# Patient Record
Sex: Female | Born: 1985 | Race: Black or African American | Hispanic: No | Marital: Married | Smoking: Never smoker
Health system: Southern US, Community
[De-identification: ages and names within clinical notes are randomized; demographics above are authoritative.]

## PROBLEM LIST (undated history)

## (undated) DIAGNOSIS — E079 Disorder of thyroid, unspecified: Secondary | ICD-10-CM

## (undated) DIAGNOSIS — I10 Essential (primary) hypertension: Secondary | ICD-10-CM

## (undated) DIAGNOSIS — E669 Obesity, unspecified: Secondary | ICD-10-CM

## (undated) DIAGNOSIS — Z5189 Encounter for other specified aftercare: Secondary | ICD-10-CM

## (undated) DIAGNOSIS — D649 Anemia, unspecified: Secondary | ICD-10-CM

## (undated) DIAGNOSIS — E119 Type 2 diabetes mellitus without complications: Secondary | ICD-10-CM

## (undated) HISTORY — PX: CARPAL TUNNEL RELEASE: SHX101

## (undated) HISTORY — PX: LEEP: SHX91

## (undated) HISTORY — PX: THYROID SURGERY: SHX805

---
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
## 2008-02-18 ENCOUNTER — Emergency Department (HOSPITAL_BASED_OUTPATIENT_CLINIC_OR_DEPARTMENT_OTHER): Admission: EM | Admit: 2008-02-18 | Discharge: 2008-02-18 | Payer: Self-pay | Admitting: Emergency Medicine
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
## 2013-04-08 ENCOUNTER — Emergency Department (HOSPITAL_COMMUNITY)
Admission: EM | Admit: 2013-04-08 | Discharge: 2013-04-08 | Disposition: A | Discharge status: Home / Self Care | Payer: Medicaid Other | Attending: Emergency Medicine | Admitting: Emergency Medicine

## 2013-04-08 ENCOUNTER — Encounter (HOSPITAL_COMMUNITY): Payer: Self-pay | Admitting: Emergency Medicine

## 2013-04-08 DIAGNOSIS — IMO0002 Reserved for concepts with insufficient information to code with codable children: Secondary | ICD-10-CM | POA: Insufficient documentation

## 2013-04-08 DIAGNOSIS — L02412 Cutaneous abscess of left axilla: Secondary | ICD-10-CM

## 2013-04-08 DIAGNOSIS — E119 Type 2 diabetes mellitus without complications: Secondary | ICD-10-CM | POA: Insufficient documentation

## 2013-04-08 HISTORY — DX: Type 2 diabetes mellitus without complications: E11.9

## 2013-04-08 MED ORDER — SULFAMETHOXAZOLE-TRIMETHOPRIM 800-160 MG PO TABS: 1.0000 | ORAL_TABLET | Freq: Two times a day (BID) | ORAL | Status: AC

## 2013-04-08 MED ORDER — KETOROLAC TROMETHAMINE 60 MG/2ML IM SOLN
60.0000 mg | Freq: Once | INTRAMUSCULAR | Status: AC
  Administered 2013-04-08: 60 mg via INTRAMUSCULAR
  Filled 2013-04-08: qty 2

## 2013-04-08 NOTE — ED Provider Notes (Signed)
Medical screening examination/treatment/procedure(s) were performed by non-physician practitioner and as supervising physician I was immediately available for consultation/collaboration.  EKG Interpretation   None         Avis Tirone M Natalin Bible, MD 04/08/13 1657 

## 2013-04-08 NOTE — ED Notes (Signed)
Pt c/o abscess possible cyst under left arm x 3 months; pt sts hx of same and now having more pain; pt denies drainage

## 2013-04-08 NOTE — ED Provider Notes (Signed)
CSN: [id number]     Arrival date & time 04/08/13  1027 History  This chart was scribed for non-physician practitioner, Irish Elders, FNP,working with Lyanne Co, MD, by Karle Plumber, ED Scribe.  This patient was seen in room TR11C/TR11C and the patient's care was started at 11:01 AM.  Chief Complaint  Patient presents with  . Abscess   Patient is a 27 y.o. female presenting with abscess. The history is provided by the patient. No language interpreter was used.  Abscess Abscess location: left axillary. Size:  2-3 cm Abscess quality: painful   Abscess quality: not draining, no fluctuance, no induration, no itching, no redness, no warmth and not weeping   Red streaking: no   Duration:  3 months Progression:  Worsening Pain details:    Quality: sore.   Severity:  Moderate   Duration:  3 days   Timing:  Constant   Progression:  Worsening Chronicity:  Recurrent Context: diabetes   Relieved by:  Nothing Ineffective treatments:  Warm compresses Associated symptoms: no fever, no nausea and no vomiting   Risk factors: prior abscess    HPI Comments:  Colleen Schultz is a 27 y.o. female who presents to the Emergency Department complaining of a recurrent abscess under her left arm for approximately three months. She reports she has had no pain or issue with the abscess until two days ago. She now reports moderate tenderness of the area. She states she had an abscess in the same area approximately one year ago that was drained and healed well. She states the abscess comes and goes on its own intermittently. She states her PCP prescribed an antibiotic that resolved the abscess 4-5 months ago, but it returned again. She reports applying warm compresses and using a heating pad with no relief.  Pt denies fever, nausea, or vomiting.  Past Medical History  Diagnosis Date  . Diabetes mellitus without complication    History reviewed. No pertinent past surgical history. History reviewed. No  pertinent family history. History  Substance Use Topics  . Smoking status: Never Smoker   . Smokeless tobacco: Not on file  . Alcohol Use: No   OB History   Grav Para Term Preterm Abortions TAB SAB Ect Mult Living                 Review of Systems  Constitutional: Negative for fever.  Gastrointestinal: Negative for nausea and vomiting.  Skin:       Abscess  All other systems reviewed and are negative.    Allergies  Review of patient's allergies indicates no known allergies.  Home Medications  No current outpatient prescriptions on file. Triage Vitals: BP 151/93  Pulse 83  Temp(Src) 98.3 F (36.8 C) (Oral)  Resp 18  SpO2 100% Physical Exam  Constitutional: She is oriented to person, place, and time. She appears well-developed and well-nourished.  HENT:  Head: Normocephalic and atraumatic.  Neck: Neck supple.  Cardiovascular: Normal rate, regular rhythm and normal heart sounds.   Pulmonary/Chest: Effort normal and breath sounds normal. No respiratory distress. She has no wheezes. She has no rales. She exhibits no tenderness.  Musculoskeletal: Normal range of motion. She exhibits no edema and no tenderness.  Neurological: She is alert and oriented to person, place, and time. No cranial nerve deficit.  Skin: Skin is warm and dry.  2-3 cm left axillary cyst.  Psychiatric: She has a normal mood and affect. Her behavior is normal.    ED Course  Procedures (including critical care time) DIAGNOSTIC STUDIES: Oxygen Saturation is 100% on RA, normal by my interpretation.   COORDINATION OF CARE: 11:07 AM- Will give injection of Toradol here in ED and refer to surgeon. Pt verbalizes understanding and agrees to plan.  Medications  ketorolac (TORADOL) injection 60 mg (not administered)    Labs Review Labs Reviewed - No data to display Imaging Review No results found.  EKG Interpretation   None       MDM   1. Abscess of axilla, left    Afebrile, well-appearing.  Left axillary abscess. Reports that she has had repetitive problems with abscess in this same location for about the last year. She has had it drained before and has taken antibiotics for it as well. Bactrim as directed and follow-up with surgery for possible excision.   I personally performed the services described in this documentation, which was scribed in my presence. The recorded information has been reviewed and is accurate.    Irish Elders, NP 04/08/13 1246

## 2014-01-04 ENCOUNTER — Emergency Department (HOSPITAL_COMMUNITY): Payer: Medicaid Other

## 2014-01-04 ENCOUNTER — Emergency Department (HOSPITAL_COMMUNITY)
Admission: EM | Admit: 2014-01-04 | Discharge: 2014-01-04 | Disposition: A | Discharge status: Home / Self Care | Payer: Medicaid Other | Attending: Emergency Medicine | Admitting: Emergency Medicine

## 2014-01-04 ENCOUNTER — Encounter (HOSPITAL_COMMUNITY): Payer: Self-pay | Admitting: Emergency Medicine

## 2014-01-04 DIAGNOSIS — R110 Nausea: Secondary | ICD-10-CM

## 2014-01-04 DIAGNOSIS — Z794 Long term (current) use of insulin: Secondary | ICD-10-CM | POA: Insufficient documentation

## 2014-01-04 DIAGNOSIS — R0789 Other chest pain: Secondary | ICD-10-CM | POA: Diagnosis not present

## 2014-01-04 DIAGNOSIS — Z79899 Other long term (current) drug therapy: Secondary | ICD-10-CM | POA: Diagnosis not present

## 2014-01-04 DIAGNOSIS — Z3202 Encounter for pregnancy test, result negative: Secondary | ICD-10-CM | POA: Diagnosis not present

## 2014-01-04 DIAGNOSIS — E119 Type 2 diabetes mellitus without complications: Secondary | ICD-10-CM | POA: Insufficient documentation

## 2014-01-04 DIAGNOSIS — K089 Disorder of teeth and supporting structures, unspecified: Secondary | ICD-10-CM | POA: Diagnosis present

## 2014-01-04 DIAGNOSIS — K0889 Other specified disorders of teeth and supporting structures: Secondary | ICD-10-CM

## 2014-01-04 LAB — COMPREHENSIVE METABOLIC PANEL
ALT: 10 U/L (ref 0–35)
AST: 19 U/L (ref 0–37)
Albumin: 3.8 g/dL (ref 3.5–5.2)
Alkaline Phosphatase: 62 U/L (ref 39–117)
Anion gap: 11 (ref 5–15)
BUN: 10 mg/dL (ref 6–23)
CALCIUM: 9.1 mg/dL (ref 8.4–10.5)
CO2: 23 meq/L (ref 19–32)
CREATININE: 0.85 mg/dL (ref 0.50–1.10)
Chloride: 106 mEq/L (ref 96–112)
GFR calc Af Amer: >90 mL/min (ref >90)
Glucose, Bld: 94 mg/dL (ref 70–99)
Potassium: 4.1 mEq/L (ref 3.7–5.3)
SODIUM: 140 meq/L (ref 137–147)
TOTAL PROTEIN: 7.9 g/dL (ref 6.0–8.3)
Total Bilirubin: 0.6 mg/dL (ref 0.3–1.2)

## 2014-01-04 LAB — CBC
HCT: 33.1 % — ABNORMAL LOW (ref 36.0–46.0)
Hemoglobin: 10.3 g/dL — ABNORMAL LOW (ref 12.0–15.0)
MCH: 22.7 pg — ABNORMAL LOW (ref 26.0–34.0)
MCHC: 31.1 g/dL (ref 30.0–36.0)
MCV: 73.1 fL — ABNORMAL LOW (ref 78.0–100.0)
PLATELETS: 236 10*3/uL (ref 150–400)
RBC: 4.53 MIL/uL (ref 3.87–5.11)
RDW: 15.2 % (ref 11.5–15.5)
WBC: 8.3 10*3/uL (ref 4.0–10.5)

## 2014-01-04 LAB — URINALYSIS, ROUTINE W REFLEX MICROSCOPIC
Bilirubin Urine: NEGATIVE
Glucose, UA: NEGATIVE mg/dL
HGB URINE DIPSTICK: NEGATIVE
Ketones, ur: NEGATIVE mg/dL
Nitrite: NEGATIVE
PROTEIN: NEGATIVE mg/dL
SPECIFIC GRAVITY, URINE: 1.026 (ref 1.005–1.030)
Urobilinogen, UA: 1 mg/dL (ref 0.0–1.0)
pH: 6 (ref 5.0–8.0)

## 2014-01-04 LAB — I-STAT TROPONIN, ED: Troponin i, poc: 0 ng/mL (ref 0.00–0.08)

## 2014-01-04 LAB — URINE MICROSCOPIC-ADD ON

## 2014-01-04 LAB — PREGNANCY, URINE: Preg Test, Ur: NEGATIVE

## 2014-01-04 MED ORDER — ONDANSETRON HCL 4 MG PO TABS: 4.0000 mg | ORAL_TABLET | Freq: Four times a day (QID) | ORAL | Status: DC

## 2014-01-04 MED ORDER — SODIUM CHLORIDE 0.9 % IV BOLUS (SEPSIS)
1000.0000 mL | Freq: Once | INTRAVENOUS | Status: AC
  Administered 2014-01-04: 1000 mL via INTRAVENOUS

## 2014-01-04 MED ORDER — KETOROLAC TROMETHAMINE 15 MG/ML IJ SOLN
30.0000 mg | Freq: Once | INTRAMUSCULAR | Status: AC
  Administered 2014-01-04: 30 mg via INTRAVENOUS
  Filled 2014-01-04: qty 2

## 2014-01-04 MED ORDER — ONDANSETRON HCL 4 MG/2ML IJ SOLN
4.0000 mg | Freq: Once | INTRAMUSCULAR | Status: AC
  Administered 2014-01-04: 4 mg via INTRAVENOUS
  Filled 2014-01-04: qty 2

## 2014-01-04 MED ORDER — IBUPROFEN 600 MG PO TABS: 600.0000 mg | ORAL_TABLET | Freq: Four times a day (QID) | ORAL | Status: DC | PRN

## 2014-01-04 NOTE — ED Notes (Signed)
Pt visitor and self brought sand which bag, graham crackers X 3 times, and beverages X 2 times over the past three hours.

## 2014-01-04 NOTE — ED Notes (Signed)
Pt c/o generalized abd pain and CP; pt sts some mouth pain also x 2 days

## 2014-01-04 NOTE — Discharge Instructions (Signed)

## 2014-01-04 NOTE — ED Provider Notes (Signed)
A 28 year old female comes in with about a three-day history of anterior chest pain which is worse with movement and deep breathing, and also pain in left T. She points to tooth #12 as the one that is hurting but on exam, she has tenderness to percussion of teeth #12 through 15. Oropharynx is clear and TMs are clear. There is no gingival erythema or pallor or swelling. No obvious active caries are present. Lungs are clear. Heart has regular rate and rhythm. There is prominent tenderness of the parasternal areas bilaterally indicating probable chest wall pain. She is referred to the on-call dentist for evaluation of her teeth and is advised to use NSAIDs for pain.  I saw and evaluated the patient, reviewed the resident's note and I agree with the findings and plan.   EKG Interpretation   Date/Time:  Monday January 04 2014 14:04:06 EDT Ventricular Rate:  59 PR Interval:  114 QRS Duration: 96 QT Interval:  422 QTC Calculation: 417 R Axis:   57 Text Interpretation:  Sinus bradycardia Otherwise normal ECG No old  tracing to compare Confirmed by [facility]  MD, Kess Mcilwain ([id number]) on 01/04/2014  5:24:05 PM        Dione Booze, MD 01/04/14 1724

## 2014-01-04 NOTE — ED Provider Notes (Signed)
CSN: [id number]     Arrival date & time 01/04/14  1354 History   First MD Initiated Contact with Patient 01/04/14 1555     Chief Complaint  Patient presents with  . Chest Pain  . Dental Pain     (Consider location/radiation/quality/duration/timing/severity/associated sxs/prior Treatment) HPI  Colleen Schultz is a 28 y.o. female with a history of diabetes, presenting for chest pain and dental pain. Patient has difficulty giving specifics regarding her multiple complaints. She is currently endorsing chest pain on the left side of her chest, associated with tenderness on palpation. Endorses a recent cough. No association with exertion. No shortness of breath. Endorses nausea, no vomiting, no diarrhea. Currently sexually active without use of condoms. Patient was previously on Depo shots but discontinued this as of December last year. Patient is due to start her menstrual cycle in the next day or so. Endorses regular menstrual cycles. LMP approximately 1 month ago. Denies any vaginal complaints at this time, no abdominal pain at this time. No dysuria. Nausea has been ongoing for approximately 2 weeks. Patient has had dental pain for approximately 2 days around her left maxillary first premolar.     Past Medical History  Diagnosis Date  . Diabetes mellitus without complication    History reviewed. No pertinent past surgical history. History reviewed. No pertinent family history. History  Substance Use Topics  . Smoking status: Never Smoker   . Smokeless tobacco: Not on file  . Alcohol Use: No   OB History   Grav Para Term Preterm Abortions TAB SAB Ect Mult Living                 Review of Systems  Constitutional: Negative.   HENT: Positive for dental problem.   Eyes: Negative.   Respiratory: Positive for cough.   Cardiovascular: Positive for chest pain.  Gastrointestinal: Negative.   Endocrine: Negative.   Genitourinary: Negative.   Musculoskeletal: Negative.   Skin: Negative.    Allergic/Immunologic: Negative.   Neurological: Negative.   Hematological: Negative.   Psychiatric/Behavioral: Negative.       Allergies  Adhesive and Cinnamon  Home Medications   Prior to Admission medications   Medication Sig Start Date End Date Taking? Authorizing Provider  busPIRone (BUSPAR) 15 MG tablet Take 15 mg by mouth 2 (two) times daily.   Yes Historical Provider, MD  hydrOXYzine (ATARAX/VISTARIL) 25 MG tablet Take 25-50 mg by mouth at bedtime.    Yes Historical Provider, MD  insulin aspart (NOVOLOG FLEXPEN) 100 UNIT/ML SOPN FlexPen Inject 8 Units into the skin daily.   Yes Historical Provider, MD  insulin NPH (NOVOLIN N) 100 UNIT/ML injection Inject 8 Units into the skin daily before breakfast.   Yes Historical Provider, MD  Iron-FA-B Cmp-C-Biot-Probiotic (FUSION PLUS PO) Take 1 capsule by mouth 3 (three) times daily.   Yes Historical Provider, MD  lisinopril (PRINIVIL,ZESTRIL) 2.5 MG tablet Take 2.5 mg by mouth daily.   Yes Historical Provider, MD  ibuprofen (ADVIL,MOTRIN) 600 MG tablet Take 1 tablet (600 mg total) by mouth every 6 (six) hours as needed. 01/04/14   Gavin Pound, MD  ondansetron (ZOFRAN) 4 MG tablet Take 1 tablet (4 mg total) by mouth every 6 (six) hours. 01/04/14   Gavin Pound, MD   BP 114/69  Pulse 63  Temp(Src) 98.3 F (36.8 C) (Oral)  Resp 12  SpO2 98%  LMP 11/30/2013 Physical Exam  Nursing note and vitals reviewed. Constitutional: She is oriented to person, place, and time. She  appears well-developed and well-nourished. No distress.  HENT:  Head: Normocephalic and atraumatic.  Right Ear: External ear normal.  Left Ear: External ear normal.  Nose: Nose normal.  Mouth/Throat: Oropharynx is clear and moist. No oral lesions. No trismus in the jaw. No dental abscesses. No oropharyngeal exudate.    Eyes: Conjunctivae and EOM are normal. Pupils are equal, round, and reactive to light. Right eye exhibits no discharge. Left eye exhibits no  discharge. No scleral icterus.  Neck: Normal range of motion. Neck supple. No JVD present. No tracheal deviation present. No thyromegaly present.  Cardiovascular: Normal rate, regular rhythm, normal heart sounds and intact distal pulses.  Exam reveals no gallop and no friction rub.   No murmur heard. Pulmonary/Chest: Effort normal and breath sounds normal. No stridor. No respiratory distress. She has no wheezes. She has no rales. She exhibits tenderness.  Abdominal: Soft. She exhibits no distension. There is no tenderness. There is no rebound and no guarding.  Musculoskeletal: Normal range of motion. She exhibits no edema and no tenderness.  Lymphadenopathy:    She has no cervical adenopathy.  Neurological: She is alert and oriented to person, place, and time. She is not disoriented. GCS eye subscore is 4. GCS verbal subscore is 5. GCS motor subscore is 6.  Skin: Skin is warm and dry. No rash noted. She is not diaphoretic. No erythema. No pallor.  Psychiatric: She has a normal mood and affect. Her behavior is normal. Judgment and thought content normal.    ED Course  Procedures (including critical care time) Labs Review Labs Reviewed  CBC - Abnormal; Notable for the following:    Hemoglobin 10.3 (*)    HCT 33.1 (*)    MCV 73.1 (*)    MCH 22.7 (*)    All other components within normal limits  URINALYSIS, ROUTINE W REFLEX MICROSCOPIC - Abnormal; Notable for the following:    APPearance HAZY (*)    Leukocytes, UA SMALL (*)    All other components within normal limits  URINE MICROSCOPIC-ADD ON - Abnormal; Notable for the following:    Squamous Epithelial / LPF MANY (*)    Bacteria, UA MANY (*)    All other components within normal limits  COMPREHENSIVE METABOLIC PANEL  PREGNANCY, URINE  I-STAT TROPOININ, ED  POC URINE PREG, ED    Imaging Review Dg Chest 2 View  01/04/2014   CLINICAL DATA:  Mid and left-sided chest pain for 2 days  EXAM: CHEST  2 VIEW  COMPARISON:  None.  FINDINGS:  The lungs are well-expanded and clear. The heart and pulmonary vascularity is normal. The mediastinum is normal in width. There is no pleural effusion. The bony thorax is unremarkable.  IMPRESSION: There is no acute cardiopulmonary abnormality.   Electronically Signed   By: David  [place]   On: 01/04/2014 14:58     EKG Interpretation   Date/Time:  Monday January 04 2014 14:04:06 EDT Ventricular Rate:  59 PR Interval:  114 QRS Duration: 96 QT Interval:  422 QTC Calculation: 417 R Axis:   57 Text Interpretation:  Sinus bradycardia Otherwise normal ECG No old  tracing to compare Confirmed by [facility]  MD, DAVID ([id number]) on 01/04/2014  5:24:05 PM      MDM   Final diagnoses:  Pain, dental  Musculoskeletal chest pain  Nausea    Patient symptoms consistent with possible pregnancy, however pregnancy testing is negative. Low concern for cardiac etiology due to duration of symptoms, and atypical findings, also associated  with palpation of chest wall. Likely costochondral chest pain. Possibly associated with viral illness due to patient's cough. No sign of pneumonia. Low concern for PE as pt is PERC negative.  Will treat patient symptomatically and reevaluate. Patient has an appetite following antiemetics and is feeling better. Will discharge with Zofran, and Motrin for pain. Patient advised to follow up with dentistry on call for her dental pain and provided with followup information. No sign of overt abscess, or significant soft tissue infection around her left maxillary first premolar area, no indication for antibiotics.  Patient given return precautions for Non-specific chest pain, and dental pain.  Advised to return for worsening symptoms including chest pain, shortness of breath, severe headache, intractable nausea or vomiting, fever, or chills, inability to take medications, or other acute concerns.  Advised to follow up with dentist in 2 days.  Patient in agreement with and expressed understanding  of follow plan, plan of care, and return precautions.  All questions answered prior to discharge.  Patient was discharged in stable condition, ambulating without difficulty.  Patient care was discussed with my attending, Dr. Preston Fleeting.   Gavin Pound, MD 01/05/14 [phone]

## 2015-03-11 ENCOUNTER — Encounter (HOSPITAL_BASED_OUTPATIENT_CLINIC_OR_DEPARTMENT_OTHER): Payer: Self-pay | Admitting: *Deleted

## 2015-03-11 ENCOUNTER — Emergency Department (HOSPITAL_BASED_OUTPATIENT_CLINIC_OR_DEPARTMENT_OTHER)
Admission: EM | Admit: 2015-03-11 | Discharge: 2015-03-12 | Disposition: A | Discharge status: Home / Self Care | Payer: Medicaid Other | Attending: Emergency Medicine | Admitting: Emergency Medicine

## 2015-03-11 DIAGNOSIS — G8918 Other acute postprocedural pain: Secondary | ICD-10-CM

## 2015-03-11 DIAGNOSIS — E119 Type 2 diabetes mellitus without complications: Secondary | ICD-10-CM | POA: Insufficient documentation

## 2015-03-11 DIAGNOSIS — Z79899 Other long term (current) drug therapy: Secondary | ICD-10-CM | POA: Insufficient documentation

## 2015-03-11 DIAGNOSIS — M79601 Pain in right arm: Secondary | ICD-10-CM | POA: Insufficient documentation

## 2015-03-11 DIAGNOSIS — Z794 Long term (current) use of insulin: Secondary | ICD-10-CM | POA: Insufficient documentation

## 2015-03-11 MED ORDER — OXYCODONE-ACETAMINOPHEN 5-325 MG PO TABS: 1.0000 | ORAL_TABLET | Freq: Four times a day (QID) | ORAL | Status: DC | PRN

## 2015-03-11 MED ORDER — HYDROMORPHONE HCL 1 MG/ML IJ SOLN
1.0000 mg | Freq: Once | INTRAMUSCULAR | Status: AC
  Administered 2015-03-12: 1 mg via INTRAMUSCULAR
  Filled 2015-03-11: qty 1

## 2015-03-11 MED ORDER — ETODOLAC 300 MG PO CAPS: 300.0000 mg | ORAL_CAPSULE | Freq: Three times a day (TID) | ORAL | Status: DC

## 2015-03-11 NOTE — Discharge Instructions (Signed)
Pain Relief Preoperatively and Postoperatively  If you have questions, problems, or concerns about the pain that you may feel after surgery, let your health care provider know. Patients have the right to assessment and management of pain. Severe pain after surgery--and the fear or anxiety associated with that pain--may cause extreme discomfort that:  · Prevents sleep.  · Decreases the ability to breathe deeply and to cough. This can result in pneumonia or other upper airway infections.  · Causes the heart to beat more quickly and the blood pressure to be higher.  · Increases the risk for constipation and bloating.  · Decreases the ability of wounds to heal.  · May result in depression, increased anxiety, and feelings of helplessness.  Relieving pain before surgery (preoperatively) is also important because it lessens pain that you have after surgery (postoperatively). Patients who receive pain relief both before and after surgery experience greater pain relief than those who receive pain relief only after surgery. Let your health care provider know if you are having uncontrolled pain. This is very important. Pain after surgery is more difficult to manage if it is severe, so receiving prompt and adequate treatment of acute pain is necessary. If you become constipated after taking pain medicine, drink more liquids if you can. Your health care provider may have you take a mild laxative.  PAIN CONTROL METHODS  Your health care providers follow policies and procedures about the management of your pain. These guidelines should be explained to you before surgery. Plans for pain control after surgery must be decided upon by you and your health care provider and put into use with your full understanding and agreement. Do not be afraid to ask questions about the care that you are receiving.  Your health care providers will attempt to control your pain in various ways, and these methods may be used together (multimodal  analgesia). Using this approach has many benefits for you, including being able to eat, move around, and leave the hospital sooner.  As-Needed Pain Control  · You may be given pain medicine through an IV tube or as a pill or liquid that you can swallow. Let your health care provider know when you are having pain, and he or she will give you the pain medicine that is ordered for you.  IV Patient-Controlled Analgesia (PCA) Pump  · You can receive your pain medicine through an IV tube that goes into one of your veins. You can control the amount of pain medicine that you get. The pain medicine is controlled by a pump. When you push the button that is hooked up to this pump, you receive a specific amount of pain medicine. This button should be pushed only by you or by someone who is specifically assigned by you to do so. It is set up to keep you from accidentally giving yourself too much pain medicine. You will be able to start using your pain pump in the recovery room after your surgery. This method can be helpful for most types of surgery.  · Tell your health care provider:    If you are having too much pain.    If you are feeling too sleepy or nauseous.  Continuous Epidural Pain Control  · A thin, soft tube (catheter) is put into your back, outside the outer layer of your spinal cord. Pain medicine flows through the catheter to lessen pain in areas of your body that are below the level of catheter placement. Continuous epidural pain control may work best for you   if you are having surgery on your abdomen, hip area, or legs. The epidural catheter is usually put into your back shortly before surgery. It is left in until you can eat, take medicine by mouth, pass urine, and have a bowel movement.  · Giving pain medicine through the epidural catheter may help you to heal more quickly because you can do these things sooner:    Regain normal bowel and bladder function.    Return to eating.    Get up and walk.  Medicine That  Numbs the Area (Local Anesthetic)  You may be given pain medicine:  · As an injection near the area of the pain (local infiltration).  · As an injection near the nerve that controls the sensation to a specific part of your body (peripheral nerve block).  · In your spine to block pain (spinal block).  · Through a local anesthetic reservoir pump. If your surgeon or anesthesiologist selects this option as a part of your pain control, one or more thin, soft tubes will be inserted into your incision site(s) at the end of surgery. These tubes will be connected to a device that is filled with a non-narcotic pain medicine. This medicine gradually empties into your incision site over the next several days. Usually, after all of the medicine is used, your health care provider will remove the tubes and throw away the device.  Opioids  · Moderate to moderately severe acute pain after surgery may respond to opioids. Opioids are narcotic pain medicine. Opioids are often combined with non-narcotic medicines to improve pain relief, lower the risk of side effects, and reduce the chance of addiction.  · If you follow your health care provider's directions about taking opioids and you do not have a history of substance abuse, your risk of becoming addicted is very small. To prevent addiction, opioids are given for short periods of time in careful doses.  Other Methods of Pain Control  · Steroids.  · Physical therapy.  · Heat and cold therapy.  · Compression, such as wrapping an elastic bandage around the area of the pain.  · Massage.     This information is not intended to replace advice given to you by your health care provider. Make sure you discuss any questions you have with your health care provider.     Document Released: 07/21/2002 Document Revised: 05/21/2014 Document Reviewed: 07/25/2010  Elsevier Interactive Patient Education ©2016 [employer]

## 2015-03-11 NOTE — ED Notes (Signed)
Pt had right carpal tunnel surgery yesterday, reports that the numbness has worn off and and she is hurting.  Pt reports that she lost her f/u paperwork.  Pt took 4 Vicodin in the last hour without relief.

## 2015-03-11 NOTE — ED Provider Notes (Signed)
CSN: [id number]     Arrival date & time 03/11/15  2156 History   First MD Initiated Contact with Patient 03/11/15 2344     Chief Complaint  Patient presents with  . Arm Pain   HPI Comments: Patient had carpal tunnel surgery performed yesterday. Patient states since the numbing medicine has worn off she has had increasing pain. Pain is throbbing in the right wrist Patient has been taking the pain medications that were given to her. She took 4 Vicodin earlier without relief.  Patient denies any fevers or chills.  Patient is a 29 y.o. female presenting with arm pain. The history is provided by the patient.  Arm Pain This is a new problem. The problem occurs constantly. The problem has been gradually worsening. Pertinent negatives include no abdominal pain, no headaches and no shortness of breath. Exacerbated by: Palpation and movement. Nothing relieves the symptoms. She has tried nothing for the symptoms.    Past Medical History  Diagnosis Date  . Diabetes mellitus without complication (HCC)    History reviewed. No pertinent past surgical history. History reviewed. No pertinent family history. Social History  Substance Use Topics  . Smoking status: Never Smoker   . Smokeless tobacco: None  . Alcohol Use: No   OB History    No data available     Review of Systems  Respiratory: Negative for shortness of breath.   Gastrointestinal: Negative for abdominal pain.  Neurological: Negative for headaches.  All other systems reviewed and are negative.     Allergies  Adhesive and Cinnamon  Home Medications   Prior to Admission medications   Medication Sig Start Date End Date Taking? Authorizing Provider  busPIRone (BUSPAR) 15 MG tablet Take 15 mg by mouth 2 (two) times daily.    Historical Provider, MD  etodolac (LODINE) 300 MG capsule Take 1 capsule (300 mg total) by mouth every 8 (eight) hours. 03/11/15   Linwood Dibbles, MD  hydrOXYzine (ATARAX/VISTARIL) 25 MG tablet Take 25-50 mg by  mouth at bedtime.     Historical Provider, MD  insulin aspart (NOVOLOG FLEXPEN) 100 UNIT/ML SOPN FlexPen Inject 8 Units into the skin daily.    Historical Provider, MD  insulin NPH (NOVOLIN N) 100 UNIT/ML injection Inject 8 Units into the skin daily before breakfast.    Historical Provider, MD  Iron-FA-B Cmp-C-Biot-Probiotic (FUSION PLUS PO) Take 1 capsule by mouth 3 (three) times daily.    Historical Provider, MD  lisinopril (PRINIVIL,ZESTRIL) 2.5 MG tablet Take 2.5 mg by mouth daily.    Historical Provider, MD  ondansetron (ZOFRAN) 4 MG tablet Take 1 tablet (4 mg total) by mouth every 6 (six) hours. 01/04/14   Gavin Pound, MD  oxyCODONE-acetaminophen (PERCOCET) 5-325 MG tablet Take 1 tablet by mouth every 6 (six) hours as needed. 03/11/15   Linwood Dibbles, MD   BP 124/74 mmHg  Pulse 79  Temp(Src) 97.9 F (36.6 C)  Resp 18  SpO2 100%  LMP 01/08/2015 Physical Exam  Constitutional: She appears well-developed and well-nourished. No distress.  HENT:  Head: Normocephalic and atraumatic.  Right Ear: External ear normal.  Left Ear: External ear normal.  Eyes: Conjunctivae are normal. Right eye exhibits no discharge. Left eye exhibits no discharge. No scleral icterus.  Neck: Neck supple. No tracheal deviation present.  Cardiovascular: Normal rate.   Pulmonary/Chest: Effort normal. No stridor. No respiratory distress.  Musculoskeletal: She exhibits tenderness. She exhibits no edema.  Dressing was removed from the right wrist, patient has tenderness palpation  along the volar aspect of her wrist. There is a small surgical scar without evidence of erythema or drainage. She has a strong radial pulse. Distal cap refill is less than 2 seconds. Initial light touch is intact  Neurological: She is alert. Cranial nerve deficit: no gross deficits.  Skin: Skin is warm and dry. No rash noted.  Psychiatric: She has a normal mood and affect.  Nursing note and vitals reviewed.   ED Course  Procedures  (including critical care time)   MDM   Final diagnoses:  Post-operative pain    Patient has no evidence of infection or vascular compromise. We'll give her dose of Dilaudid IM for pain relief. I suggested she contact her surgeon to let them know about her persistent pain.  At this time there does not appear to be any evidence of an acute emergency medical condition and the patient appears stable for discharge with appropriate outpatient follow up.     Linwood DibblesJon Brettany Sydney, MD 03/12/15 0000

## 2018-04-03 ENCOUNTER — Ambulatory Visit (INDEPENDENT_AMBULATORY_CARE_PROVIDER_SITE_OTHER): Payer: Self-pay

## 2018-04-03 ENCOUNTER — Other Ambulatory Visit: Payer: Self-pay | Admitting: Gerontology

## 2018-04-03 DIAGNOSIS — M25512 Pain in left shoulder: Secondary | ICD-10-CM

## 2018-04-03 DIAGNOSIS — R52 Pain, unspecified: Secondary | ICD-10-CM

## 2018-04-03 DIAGNOSIS — M533 Sacrococcygeal disorders, not elsewhere classified: Secondary | ICD-10-CM

## 2018-04-03 DIAGNOSIS — M545 Low back pain: Secondary | ICD-10-CM

## 2018-04-03 DIAGNOSIS — M79642 Pain in left hand: Secondary | ICD-10-CM

## 2019-02-02 ENCOUNTER — Other Ambulatory Visit: Payer: Self-pay

## 2019-02-02 ENCOUNTER — Encounter (HOSPITAL_BASED_OUTPATIENT_CLINIC_OR_DEPARTMENT_OTHER): Payer: Self-pay | Admitting: Emergency Medicine

## 2019-02-02 ENCOUNTER — Emergency Department (HOSPITAL_BASED_OUTPATIENT_CLINIC_OR_DEPARTMENT_OTHER): Payer: Medicaid Other

## 2019-02-02 ENCOUNTER — Emergency Department (HOSPITAL_BASED_OUTPATIENT_CLINIC_OR_DEPARTMENT_OTHER)
Admission: EM | Admit: 2019-02-02 | Discharge: 2019-02-02 | Disposition: A | Discharge status: Home / Self Care | Payer: Medicaid Other | Attending: Emergency Medicine | Admitting: Emergency Medicine

## 2019-02-02 DIAGNOSIS — I10 Essential (primary) hypertension: Secondary | ICD-10-CM | POA: Insufficient documentation

## 2019-02-02 DIAGNOSIS — Z79899 Other long term (current) drug therapy: Secondary | ICD-10-CM | POA: Insufficient documentation

## 2019-02-02 DIAGNOSIS — E119 Type 2 diabetes mellitus without complications: Secondary | ICD-10-CM | POA: Diagnosis not present

## 2019-02-02 DIAGNOSIS — M25511 Pain in right shoulder: Secondary | ICD-10-CM | POA: Insufficient documentation

## 2019-02-02 HISTORY — DX: Obesity, unspecified: E66.9

## 2019-02-02 HISTORY — DX: Essential (primary) hypertension: I10

## 2019-02-02 HISTORY — DX: Anemia, unspecified: D64.9

## 2019-02-02 NOTE — ED Triage Notes (Signed)
Right shoulder pain for a few weeks with no known injury.

## 2019-02-02 NOTE — ED Notes (Signed)
Patient transported to X-ray 

## 2019-02-02 NOTE — Discharge Instructions (Addendum)
Symptoms suggestive of rotator cuff injury.  Make an appointment to follow-up with sports medicine upstairs.  Wear the sling for your comfort.

## 2019-02-02 NOTE — ED Provider Notes (Signed)
[facility] Provider Note   CSN: [id number] Arrival date & time: 02/02/19  5102     History   Chief Complaint Chief Complaint  Patient presents with   Shoulder Pain    HPI Colleen Schultz is a 33 y.o. female.     Patient with several week history of right shoulder pain.  Gotten worse lately.  Now cannot raise arm above head.  No known injury.  No known history of any heavy lifting.  No prior history of similar pain.  No numbness to the hand other than some baseline carpal tunnel numbness but nothing new or worse.     Past Medical History:  Diagnosis Date   Anemia    Diabetes mellitus without complication ([place])    Hypertension    Obesity     There are no active problems to display for this patient.   Past Surgical History:  Procedure Laterality Date   CARPAL TUNNEL RELEASE     CESAREAN SECTION     LEEP     THYROID SURGERY       OB History   No obstetric history on file.      Home Medications    Prior to Admission medications   Medication Sig Start Date End Date Taking? Authorizing Provider  Iron-FA-B Cmp-C-Biot-Probiotic (FUSION PLUS PO) Take 1 capsule by mouth 3 (three) times daily.    [provider]  lisinopril (PRINIVIL,ZESTRIL) 2.5 MG tablet Take 2.5 mg by mouth daily.    [provider]  meloxicam (MOBIC) 15 MG tablet Take 1 tablet (15 mg total) by mouth daily. 01/07/19   [provider]  pregabalin (LYRICA) 200 MG capsule Take by mouth.    [provider]  Prenatal Vit-Iron Carbonyl-FA (PRENATAL PLUS IRON) 29-1 MG TABS Take 1 tablet by mouth daily. 01/20/19   [provider]  promethazine (PHENERGAN) 12.5 MG tablet Take 1 tablet (12.5 mg total) by mouth every 6 (six) hours as needed for up to 30 days for Nausea 12/22/18   [provider]    Family History No family history on file.  Social History Social History   Tobacco Use   Smoking status: Never  Smoker   Smokeless tobacco: Never Used  Substance Use Topics   Alcohol use: No   Drug use: No     Allergies   Adhesive [tape] and Cinnamon   Review of Systems Review of Systems  Constitutional: Negative for chills and fever.  HENT: Negative for congestion, rhinorrhea and sore throat.   Eyes: Negative for visual disturbance.  Respiratory: Negative for cough and shortness of breath.   Cardiovascular: Negative for chest pain and leg swelling.  Gastrointestinal: Negative for abdominal pain, diarrhea, nausea and vomiting.  Genitourinary: Negative for dysuria.  Musculoskeletal: Negative for back pain and neck pain.  Skin: Negative for rash.  Neurological: Negative for dizziness, light-headedness and headaches.  Hematological: Does not bruise/bleed easily.  Psychiatric/Behavioral: Negative for confusion.     Physical Exam Updated Vital Signs BP 133/82 (BP Location: Right Arm)    Pulse 87    Temp 98.1 F (36.7 C) (Oral)    Resp 16    Ht 1.6 m (5\' 3" )    Wt 132.6 kg    LMP 12/13/2018    SpO2 100%    BMI 51.80 kg/m   Physical Exam Vitals signs and nursing note reviewed.  Constitutional:      General: She is not in acute distress.  Appearance: She is well-developed.  HENT:     Head: Normocephalic and atraumatic.  Eyes:     Extraocular Movements: Extraocular movements intact.     Conjunctiva/sclera: Conjunctivae normal.     Pupils: Pupils are equal, round, and reactive to light.  Neck:     Musculoskeletal: Normal range of motion and neck supple.  Cardiovascular:     Rate and Rhythm: Normal rate and regular rhythm.     Heart sounds: No murmur.  Pulmonary:     Effort: Pulmonary effort is normal. No respiratory distress.     Breath sounds: Normal breath sounds.  Abdominal:     Palpations: Abdomen is soft.     Tenderness: There is no abdominal tenderness.  Musculoskeletal:     Comments: Tenderness to palpation to the anterior part of the right shoulder.  No obvious  deformity or dislocation.  Increased pain with movement of arm above head.  Not able to move arm completely above head.  So therefore decreased range of motion.  Distally radial pulses 2+.  Sensation intact good movement and strength in the fingers.  No pain at wrist or elbow.  Skin:    General: Skin is warm and dry.  Neurological:     General: No focal deficit present.     Mental Status: She is alert and oriented to person, place, and time.      ED Treatments / Results  Labs (all labs ordered are listed, but only abnormal results are displayed) Labs Reviewed - No data to display  EKG None  Radiology Dg Shoulder Right  Result Date: 02/02/2019 CLINICAL DATA:  Right shoulder pain.  No known injury. EXAM: RIGHT SHOULDER - 2+ VIEW COMPARISON:  Chest x-ray report 01/04/2014. FINDINGS: No acute bony or joint abnormality. No evidence of fracture, dislocation, or separation. No focal abnormality identified. IMPRESSION: No acute or focal abnormality identified. Electronically Signed   By: Maisie Fus  Register   On: 02/02/2019 09:45    Procedures Procedures (including critical care time)  Medications Ordered in ED Medications - No data to display   Initial Impression / Assessment and Plan / ED Course  I have reviewed the triage vital signs and the nursing notes.  Pertinent labs & imaging results that were available during my care of the patient were reviewed by me and considered in my medical decision making (see chart for details).        X-ray of the right shoulder shows no bony abnormalities.  Clinically suggestive of rotator cuff injury.  Will refer to sports medicine and treat with a sling.  Patient is been taking Tylenol and Motrin already at home.  Final Clinical Impressions(s) / ED Diagnoses   Final diagnoses:  Acute pain of right shoulder    ED Discharge Orders    None       Vanetta Mulders, MD 02/02/19 1020

## 2019-02-02 NOTE — ED Notes (Signed)
ED Provider at bedside. 

## 2019-02-02 NOTE — ED Notes (Signed)
ED Provider at bedside discussing test results and disp plan of care.

## 2019-02-04 ENCOUNTER — Ambulatory Visit: Payer: Self-pay | Admitting: Family Medicine

## 2019-02-04 NOTE — Progress Notes (Deleted)
  Colleen Schultz - 33 y.o. female MRN [id number]  Date of birth: [birth year]  SUBJECTIVE:  Including CC & ROS.  No chief complaint on file.   Colleen Schultz is a 33 y.o. female that is  ***.  ***   Review of Systems  HISTORY: Past Medical, Surgical, Social, and Family History Reviewed & Updated per EMR.   Pertinent Historical Findings include:  Past Medical History:  Diagnosis Date  . Anemia   . Diabetes mellitus without complication ([place])   . Hypertension   . Obesity     Past Surgical History:  Procedure Laterality Date  . CARPAL TUNNEL RELEASE    . CESAREAN SECTION    . LEEP    . THYROID SURGERY      Allergies  Allergen Reactions  . Adhesive [Tape] Itching  . Cinnamon Hives    No family history on file.   Social History   Socioeconomic History  . Marital status: Single    Spouse name: Not on file  . Number of children: Not on file  . Years of education: Not on file  . Highest education level: Not on file  Occupational History  . Not on file  Social Needs  . Financial resource strain: Not on file  . Food insecurity    Worry: Not on file    Inability: Not on file  . Transportation needs    Medical: Not on file    Non-medical: Not on file  Tobacco Use  . Smoking status: Never Smoker  . Smokeless tobacco: Never Used  Substance and Sexual Activity  . Alcohol use: No  . Drug use: No  . Sexual activity: Not on file  Lifestyle  . Physical activity    Days per week: Not on file    Minutes per session: Not on file  . Stress: Not on file  Relationships  . Social Herbalist on phone: Not on file    Gets together: Not on file    Attends religious service: Not on file    Active member of club or organization: Not on file    Attends meetings of clubs or organizations: Not on file    Relationship status: Not on file  . Intimate partner violence    Fear of current or ex partner: Not on file    Emotionally abused: Not on file    Physically  abused: Not on file    Forced sexual activity: Not on file  Other Topics Concern  . Not on file  Social History Narrative  . Not on file     PHYSICAL EXAM:  VS: There were no vitals taken for this visit. Physical Exam Gen: NAD, alert, cooperative with exam, well-appearing ENT: normal lips, normal nasal mucosa,  Eye: normal EOM, normal conjunctiva and lids CV:  no edema, +2 pedal pulses   Resp: no accessory muscle use, non-labored,  GI: no masses or tenderness, no hernia  Skin: no rashes, no areas of induration  Neuro: normal tone, normal sensation to touch Psych:  normal insight, alert and oriented MSK:  ***      ASSESSMENT & PLAN:   No problem-specific Assessment & Plan notes found for this encounter.

## 2019-02-05 ENCOUNTER — Encounter: Payer: Self-pay | Admitting: Family Medicine

## 2019-02-05 ENCOUNTER — Ambulatory Visit: Payer: Self-pay

## 2019-02-05 ENCOUNTER — Other Ambulatory Visit: Payer: Self-pay

## 2019-02-05 ENCOUNTER — Ambulatory Visit (INDEPENDENT_AMBULATORY_CARE_PROVIDER_SITE_OTHER): Payer: Medicaid Other | Admitting: Family Medicine

## 2019-02-05 VITALS — BP 146/83 | Ht 63.0 in | Wt 279.0 lb

## 2019-02-05 DIAGNOSIS — M25511 Pain in right shoulder: Secondary | ICD-10-CM | POA: Diagnosis not present

## 2019-02-05 MED ORDER — PREDNISONE 5 MG PO TABS: ORAL_TABLET | ORAL | 0 refills | Status: DC

## 2019-02-05 NOTE — Assessment & Plan Note (Signed)
Pain seems to be associated with a capsulitis versus a labral irritation.  The rotator cuff appears to be normal with clinical exam and ultrasound.  No significant findings on ultrasound. -Prednisone.  Counseled not to take prednisone and meloxicam at the same time. -Counseled on home exercise therapy and supportive care. -Provided work note. -If no improvement consider injection or physical therapy.

## 2019-02-05 NOTE — Progress Notes (Signed)
Colleen Schultz - 33 y.o. female MRN [id number]  Date of birth: [birth year]  SUBJECTIVE:  Including CC & ROS.  Chief Complaint  Patient presents with  . Shoulder Pain    right shoulder    Colleen Schultz is a 33 y.o. female that is presenting with right shoulder pain.  The pain is been ongoing for 2 months.  She denies any specific inciting event.  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  The pain is anterior.  Denies any mechanical symptoms.  No history of injury or inciting event.  Has been taking meloxicam with some improvement.  No history of surgery.  No radicular symptoms.  Symptoms are intermittent in nature..  Independent review of the right shoulder x-ray from 9/21 shows no acute abnormality.   Review of Systems  Constitutional: Negative for fever.  HENT: Negative for congestion.   Respiratory: Negative for cough.   Cardiovascular: Negative for chest pain.  Gastrointestinal: Negative for abdominal distention.  Musculoskeletal: Negative for gait problem.  Skin: Negative for color change.  Neurological: Negative for weakness.  Hematological: Negative for adenopathy.    HISTORY: Past Medical, Surgical, Social, and Family History Reviewed & Updated per EMR.   Pertinent Historical Findings include:  Past Medical History:  Diagnosis Date  . Anemia   . Diabetes mellitus without complication ([place])   . Hypertension   . Obesity     Past Surgical History:  Procedure Laterality Date  . CARPAL TUNNEL RELEASE    . CESAREAN SECTION    . LEEP    . THYROID SURGERY      Allergies  Allergen Reactions  . Adhesive [Tape] Itching  . Cinnamon Hives    No family history on file.   Social History   Socioeconomic History  . Marital status: Single    Spouse name: Not on file  . Number of children: Not on file  . Years of education: Not on file  . Highest education level: Not on file  Occupational History  . Not on file   Social Needs  . Financial resource strain: Not on file  . Food insecurity    Worry: Not on file    Inability: Not on file  . Transportation needs    Medical: Not on file    Non-medical: Not on file  Tobacco Use  . Smoking status: Never Smoker  . Smokeless tobacco: Never Used  Substance and Sexual Activity  . Alcohol use: No  . Drug use: No  . Sexual activity: Not on file  Lifestyle  . Physical activity    Days per week: Not on file    Minutes per session: Not on file  . Stress: Not on file  Relationships  . Social Herbalist on phone: Not on file    Gets together: Not on file    Attends religious service: Not on file    Active member of club or organization: Not on file    Attends meetings of clubs or organizations: Not on file    Relationship status: Not on file  . Intimate partner violence    Fear of current or ex partner: Not on file    Emotionally abused: Not on file    Physically abused: Not on file    Forced sexual activity: Not on file  Other Topics Concern  . Not on file  Social History Narrative  . Not on file  PHYSICAL EXAM:  VS: BP (!) 146/83   Ht 5\' 3"  (1.6 m)   Wt 279 lb (126.6 kg)   BMI 49.42 kg/m  Physical Exam Gen: NAD, alert, cooperative with exam, well-appearing ENT: normal lips, normal nasal mucosa,  Eye: normal EOM, normal conjunctiva and lids CV:  no edema, +2 pedal pulses   Resp: no accessory muscle use, non-labored,   Skin: no rashes, no areas of induration  Neuro: normal tone, normal sensation to touch Psych:  normal insight, alert and oriented MSK:  Right shoulder: Normal range of motion. Normal internal and external rotation. Some pain with external rotation and abduction. Normal empty can testing. Normal speeds test. Normal grip strength. Neurovascular intact  Limited ultrasound: Right shoulder:  Normal-appearing biceps tendon short and long axis. Normal-appearing subscapularis. Normal-appearing  supraspinatus in static and dynamic testing. Normal-appearing posterior glenohumeral joint.   Summary: No specific identifying factor for her pain.  Ultrasound and interpretation by , MD      ASSESSMENT & PLAN:   Acute pain of right shoulder Pain seems to be associated with a capsulitis versus a labral irritation.  The rotator cuff appears to be normal with clinical exam and ultrasound.  No significant findings on ultrasound. -Prednisone.  Counseled not to take prednisone and meloxicam at the same time. -Counseled on home exercise therapy and supportive care. -Provided work note. -If no improvement consider injection or physical therapy.

## 2019-02-05 NOTE — Patient Instructions (Signed)
Nice to meet you Please try the exercises  Please try the prednisone. Don't take this with the mobic.  Please try heat and ice   Please send me a message in MyChart with any questions or updates.  Please see me back in 4 weeks.   --Dr. Raeford Razor

## 2019-02-09 ENCOUNTER — Other Ambulatory Visit: Payer: Self-pay

## 2019-02-09 ENCOUNTER — Encounter (HOSPITAL_BASED_OUTPATIENT_CLINIC_OR_DEPARTMENT_OTHER): Payer: Self-pay | Admitting: Emergency Medicine

## 2019-02-09 ENCOUNTER — Emergency Department (HOSPITAL_BASED_OUTPATIENT_CLINIC_OR_DEPARTMENT_OTHER)
Admission: EM | Admit: 2019-02-09 | Discharge: 2019-02-09 | Disposition: A | Discharge status: Home / Self Care | Payer: Medicaid Other | Attending: Emergency Medicine | Admitting: Emergency Medicine

## 2019-02-09 DIAGNOSIS — E119 Type 2 diabetes mellitus without complications: Secondary | ICD-10-CM | POA: Insufficient documentation

## 2019-02-09 DIAGNOSIS — E669 Obesity, unspecified: Secondary | ICD-10-CM | POA: Diagnosis not present

## 2019-02-09 DIAGNOSIS — L02411 Cutaneous abscess of right axilla: Secondary | ICD-10-CM | POA: Diagnosis not present

## 2019-02-09 DIAGNOSIS — Z79899 Other long term (current) drug therapy: Secondary | ICD-10-CM | POA: Insufficient documentation

## 2019-02-09 DIAGNOSIS — L539 Erythematous condition, unspecified: Secondary | ICD-10-CM | POA: Diagnosis present

## 2019-02-09 DIAGNOSIS — Z7984 Long term (current) use of oral hypoglycemic drugs: Secondary | ICD-10-CM | POA: Diagnosis not present

## 2019-02-09 DIAGNOSIS — L0291 Cutaneous abscess, unspecified: Secondary | ICD-10-CM

## 2019-02-09 DIAGNOSIS — Z6841 Body Mass Index (BMI) 40.0 and over, adult: Secondary | ICD-10-CM | POA: Diagnosis not present

## 2019-02-09 DIAGNOSIS — I10 Essential (primary) hypertension: Secondary | ICD-10-CM | POA: Insufficient documentation

## 2019-02-09 MED ORDER — LIDOCAINE-EPINEPHRINE (PF) 2 %-1:200000 IJ SOLN
10.0000 mL | Freq: Once | INTRAMUSCULAR | Status: AC
  Administered 2019-02-09: 10 mL
  Filled 2019-02-09 (×2): qty 10

## 2019-02-09 MED ORDER — DOXYCYCLINE HYCLATE 100 MG PO CAPS: 100.0000 mg | ORAL_CAPSULE | Freq: Two times a day (BID) | ORAL | 0 refills | Status: DC

## 2019-02-09 MED ORDER — CHLORHEXIDINE GLUCONATE 4 % EX LIQD: Freq: Every day | CUTANEOUS | 0 refills | Status: DC | PRN

## 2019-02-09 NOTE — ED Provider Notes (Signed)
[facility] EMERGENCY DEPARTMENT Provider Note   CSN: [id number] Arrival date & time: 02/09/19  0132     History   Chief Complaint Chief Complaint  Patient presents with  . Abscess    HPI Colleen Schultz is a 33 y.o. female.     HPI  This is a 33 year old female with a history of diabetes, hypertension, obesity who presents with abscess.  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  She reports she had surgery in the left axilla.  Today she has pain and swelling in the right axilla.  She has multiple prior draining abscesses.  She denies fevers.  She denies redness or overlying skin changes.  Currently she rates her pain at 6 out of 10.  Past Medical History:  Diagnosis Date  . Anemia   . Diabetes mellitus without complication ([facility])   . Hypertension   . Obesity     Patient Active Problem List   Diagnosis Date Noted  . Acute pain of right shoulder 02/05/2019    Past Surgical History:  Procedure Laterality Date  . CARPAL TUNNEL RELEASE    . CESAREAN SECTION    . LEEP    . THYROID SURGERY       OB History   No obstetric history on file.      Home Medications    Prior to Admission medications   Medication Sig Start Date End Date Taking? Authorizing Provider  Iron-FA-B Cmp-C-Biot-Probiotic (FUSION PLUS PO) Take 1 capsule by mouth 3 (three) times daily.   Yes [provider]  lisinopril (PRINIVIL,ZESTRIL) 2.5 MG tablet Take 2.5 mg by mouth daily.   Yes [provider]  meloxicam (MOBIC) 15 MG tablet Take 1 tablet (15 mg total) by mouth daily. 01/07/19  Yes [provider]  metFORMIN (GLUCOPHAGE) 500 MG tablet Take by mouth daily with breakfast.   Yes [provider]  predniSONE (DELTASONE) 5 MG tablet Take 6 pills for first day, 5 pills second day, 4 pills third day, 3 pills fourth day, 2 pills the fifth day, and 1 pill sixth day. 02/05/19  Yes Myra Rude, MD  pregabalin (LYRICA) 200 MG capsule  Take by mouth.   Yes [provider]  Prenatal Vit-Iron Carbonyl-FA (PRENATAL PLUS IRON) 29-1 MG TABS Take 1 tablet by mouth daily. 01/20/19  Yes [provider]  promethazine (PHENERGAN) 12.5 MG tablet Take 1 tablet (12.5 mg total) by mouth every 6 (six) hours as needed for up to 30 days for Nausea 12/22/18  Yes [provider]  chlorhexidine (HIBICLENS) 4 % external liquid Apply topically daily as needed. 02/09/19   Kohlton Gilpatrick, Mayer Masker, MD  doxycycline (VIBRAMYCIN) 100 MG capsule Take 1 capsule (100 mg total) by mouth 2 (two) times daily. 02/09/19   Saoirse Legere, Mayer Masker, MD    Family History No family history on file.  Social History Social History   Tobacco Use  . Smoking status: Never Smoker  . Smokeless tobacco: Never Used  Substance Use Topics  . Alcohol use: No  . Drug use: No     Allergies   Adhesive [tape] and Cinnamon   Review of Systems Review of Systems  Constitutional: Negative for fever.  Skin: Negative for color change.       Abscess  All other systems reviewed and are negative.    Physical Exam Updated Vital Signs BP (!) 144/86 (BP Location: Right Arm)   Pulse 100   Temp 98.6 F (37 C) (Oral)  Resp 20   Ht 1.6 m (5\' 3" )   Wt 125.2 kg   SpO2 100%   BMI 48.89 kg/m   Physical Exam Vitals signs and nursing note reviewed.  Constitutional:      Appearance: She is well-developed. She is obese.  HENT:     Head: Normocephalic and atraumatic.  Eyes:     Pupils: Pupils are equal, round, and reactive to light.  Neck:     Musculoskeletal: Neck supple.  Cardiovascular:     Rate and Rhythm: Normal rate and regular rhythm.  Pulmonary:     Effort: Pulmonary effort is normal. No respiratory distress.  Skin:    General: Skin is warm and dry.     Comments: Fluctuance noted right axilla, no spontaneous drainage noted, no overlying erythema  Neurological:     Mental Status: She is alert and oriented to person, place, and time.   Psychiatric:        Mood and Affect: Mood normal.      ED Treatments / Results  Labs (all labs ordered are listed, but only abnormal results are displayed) Labs Reviewed - No data to display  EKG None  Radiology No results found.  Procedures .Marland KitchenIncision and Drainage  Date/Time: 02/09/2019 2:50 AM Performed by: Merryl Hacker, MD Authorized by: Merryl Hacker, MD   Consent:    Consent obtained:  Verbal   Consent given by:  Patient   Risks discussed:  Bleeding, incomplete drainage and pain   Alternatives discussed:  No treatment Location:    Type:  Abscess   Size:  1x2   Location: Right axilla. Pre-procedure details:    Skin preparation:  Betadine Anesthesia (see MAR for exact dosages):    Anesthesia method:  Local infiltration   Local anesthetic:  Lidocaine 2% WITH epi Procedure type:    Complexity:  Simple Procedure details:    Needle aspiration: no     Incision types:  Stab incision   Incision depth:  Dermal   Scalpel blade:  11   Wound management:  Probed and deloculated   Drainage:  Purulent   Drainage amount:  Moderate   Wound treatment:  Wound left open   Packing materials:  None Post-procedure details:    Patient tolerance of procedure:  Tolerated well, no immediate complications   (including critical care time)  Medications Ordered in ED Medications  lidocaine-EPINEPHrine (XYLOCAINE W/EPI) 2 %-1:200000 (PF) injection 10 mL (10 mLs Infiltration Given 02/09/19 0227)     Initial Impression / Assessment and Plan / ED Course  I have reviewed the triage vital signs and the nursing notes.  Pertinent labs & imaging results that were available during my care of the patient were reviewed by me and considered in my medical decision making (see chart for details).        Patient presents with abscess to right axilla.  She is overall nontoxic-appearing and vital signs are reassuring.  Abscess drained without difficulty.  Patient provided with  doxycycline and Hibiclens.  After history, exam, and medical workup I feel the patient has been appropriately medically screened and is safe for discharge home. Pertinent diagnoses were discussed with the patient. Patient was given return precautions.   Final Clinical Impressions(s) / ED Diagnoses   Final diagnoses:  Abscess    ED Discharge Orders         Ordered    doxycycline (VIBRAMYCIN) 100 MG capsule  2 times daily     02/09/19 0248  chlorhexidine (HIBICLENS) 4 % external liquid  Daily PRN     02/09/19 0248           Shon BatonHorton, Arrow Emmerich F, MD 02/09/19 [phone]

## 2019-02-09 NOTE — ED Triage Notes (Signed)
Reports hx of abscesses under the arms.  Had surgery in 2017 but they continue to come back.  Reports having one under both arms.  Reports they bust and leak all the time.

## 2019-02-22 ENCOUNTER — Emergency Department (HOSPITAL_BASED_OUTPATIENT_CLINIC_OR_DEPARTMENT_OTHER): Payer: Medicaid Other

## 2019-02-22 ENCOUNTER — Other Ambulatory Visit: Payer: Self-pay

## 2019-02-22 ENCOUNTER — Encounter (HOSPITAL_BASED_OUTPATIENT_CLINIC_OR_DEPARTMENT_OTHER): Payer: Self-pay | Admitting: *Deleted

## 2019-02-22 ENCOUNTER — Emergency Department (HOSPITAL_BASED_OUTPATIENT_CLINIC_OR_DEPARTMENT_OTHER)
Admission: EM | Admit: 2019-02-22 | Discharge: 2019-02-23 | Disposition: A | Discharge status: Other Acute Inpatient Hospital | Payer: Medicaid Other | Attending: Emergency Medicine | Admitting: Emergency Medicine

## 2019-02-22 DIAGNOSIS — Z79899 Other long term (current) drug therapy: Secondary | ICD-10-CM | POA: Insufficient documentation

## 2019-02-22 DIAGNOSIS — Z20828 Contact with and (suspected) exposure to other viral communicable diseases: Secondary | ICD-10-CM | POA: Diagnosis not present

## 2019-02-22 DIAGNOSIS — D649 Anemia, unspecified: Secondary | ICD-10-CM | POA: Diagnosis not present

## 2019-02-22 DIAGNOSIS — Z7984 Long term (current) use of oral hypoglycemic drugs: Secondary | ICD-10-CM | POA: Diagnosis not present

## 2019-02-22 DIAGNOSIS — E119 Type 2 diabetes mellitus without complications: Secondary | ICD-10-CM | POA: Insufficient documentation

## 2019-02-22 DIAGNOSIS — I10 Essential (primary) hypertension: Secondary | ICD-10-CM | POA: Insufficient documentation

## 2019-02-22 DIAGNOSIS — R0602 Shortness of breath: Secondary | ICD-10-CM | POA: Diagnosis present

## 2019-02-22 HISTORY — DX: Disorder of thyroid, unspecified: E07.9

## 2019-02-22 HISTORY — DX: Encounter for other specified aftercare: Z51.89

## 2019-02-22 LAB — CBC WITH DIFFERENTIAL/PLATELET
Abs Immature Granulocytes: 0.13 10*3/uL — ABNORMAL HIGH (ref 0.00–0.07)
Basophils Absolute: 0 10*3/uL (ref 0.0–0.1)
Basophils Relative: 0 %
Eosinophils Absolute: 0.1 10*3/uL (ref 0.0–0.5)
Eosinophils Relative: 1 %
HCT: 22.8 % — ABNORMAL LOW (ref 36.0–46.0)
Hemoglobin: 6.4 g/dL — CL (ref 12.0–15.0)
Immature Granulocytes: 1 %
Lymphocytes Relative: 25 %
Lymphs Abs: 2.6 10*3/uL (ref 0.7–4.0)
MCH: 20.8 pg — ABNORMAL LOW (ref 26.0–34.0)
MCHC: 28.1 g/dL — ABNORMAL LOW (ref 30.0–36.0)
MCV: 74.3 fL — ABNORMAL LOW (ref 80.0–100.0)
Monocytes Absolute: 0.4 10*3/uL (ref 0.1–1.0)
Monocytes Relative: 4 %
Neutro Abs: 7.2 10*3/uL (ref 1.7–7.7)
Neutrophils Relative %: 69 %
Platelets: 457 10*3/uL — ABNORMAL HIGH (ref 150–400)
RBC: 3.07 MIL/uL — ABNORMAL LOW (ref 3.87–5.11)
RDW: 18.2 % — ABNORMAL HIGH (ref 11.5–15.5)
WBC: 10.4 10*3/uL (ref 4.0–10.5)
nRBC: 0.5 % — ABNORMAL HIGH (ref 0.0–0.2)

## 2019-02-22 LAB — COMPREHENSIVE METABOLIC PANEL
ALT: 15 U/L (ref 0–44)
AST: 20 U/L (ref 15–41)
Albumin: 3.8 g/dL (ref 3.5–5.0)
Alkaline Phosphatase: 50 U/L (ref 38–126)
Anion gap: 10 (ref 5–15)
BUN: 13 mg/dL (ref 6–20)
CO2: 23 mmol/L (ref 22–32)
Calcium: 9.8 mg/dL (ref 8.9–10.3)
Chloride: 106 mmol/L (ref 98–111)
Creatinine, Ser: 0.95 mg/dL (ref 0.44–1.00)
GFR calc Af Amer: >60 mL/min (ref >60)
GFR calc non Af Amer: >60 mL/min (ref >60)
Glucose, Bld: 107 mg/dL — ABNORMAL HIGH (ref 70–99)
Potassium: 3.9 mmol/L (ref 3.5–5.1)
Sodium: 139 mmol/L (ref 135–145)
Total Bilirubin: 0.3 mg/dL (ref 0.3–1.2)
Total Protein: 8.1 g/dL (ref 6.5–8.1)

## 2019-02-22 LAB — RETICULOCYTES
Immature Retic Fract: 15.4 % (ref 2.3–15.9)
RBC.: 2.99 MIL/uL — ABNORMAL LOW (ref 3.87–5.11)
Retic Count, Absolute: 65.5 10*3/uL (ref 19.0–186.0)
Retic Ct Pct: 2.2 % (ref 0.4–3.1)

## 2019-02-22 LAB — PREGNANCY, URINE: Preg Test, Ur: NEGATIVE

## 2019-02-22 LAB — BRAIN NATRIURETIC PEPTIDE: B Natriuretic Peptide: 23 pg/mL (ref 0.0–100.0)

## 2019-02-22 NOTE — ED Triage Notes (Signed)
Pt reports she had a miscarriage in August. She was started on Provera in September. States she has been bleeding x 2-3 weeks. Her Hgb in Sept was 8. She reports being SOB with exertion and fatigued. She has had blood transfusions in the past for anemia and is on iron supplements

## 2019-02-22 NOTE — ED Notes (Signed)
Portable Xray at bedside.

## 2019-02-22 NOTE — ED Notes (Addendum)
EDP notified of 6.4 Hgb.

## 2019-02-22 NOTE — ED Notes (Signed)
ED Provider at bedside. 

## 2019-02-22 NOTE — ED Provider Notes (Signed)
[facility] Provider Note   CSN: [id number] Arrival date & time: 02/22/19  2001     History   Chief Complaint Chief Complaint  Patient presents with  . Shortness of Breath    HPI Colleen Schultz is a 33 y.o. female.     HPI  33 year old female presents with dyspnea and vaginal bleeding.  She states she has been having progressive dyspnea and fatigue over the last several weeks.  She had a miscarriage over a month ago and was eventually put on Provera.  Unable to be given other meds because of a past history of blood clots.  Continues to have bleeding both heavy and light.  Had about 2 days of no bleeding but the bleeding has recurred over the last 20 days or so.  No fever, cough, chest pain.  She states that her hemoglobin at one point was 10 and then on a recheck last month was around 8.  She is on iron 3 times a day.  No leg swelling.  Past Medical History:  Diagnosis Date  . Anemia   . Blood transfusion without reported diagnosis   . Diabetes mellitus without complication ([place])   . Hypertension   . Obesity   . Thyroid disease     Patient Active Problem List   Diagnosis Date Noted  . Acute pain of right shoulder 02/05/2019    Past Surgical History:  Procedure Laterality Date  . CARPAL TUNNEL RELEASE    . CESAREAN SECTION    . LEEP    . THYROID SURGERY       OB History   No obstetric history on file.      Home Medications    Prior to Admission medications   Medication Sig Start Date End Date Taking? Authorizing Provider  chlorhexidine (HIBICLENS) 4 % external liquid Apply topically daily as needed. 02/09/19   Horton, Barbette Hair, MD  doxycycline (VIBRAMYCIN) 100 MG capsule Take 1 capsule (100 mg total) by mouth 2 (two) times daily. 02/09/19   Horton, Barbette Hair, MD  Iron-FA-B Cmp-C-Biot-Probiotic (FUSION PLUS PO) Take 1 capsule by mouth 3 (three) times daily.    [provider]  lisinopril (PRINIVIL,ZESTRIL) 2.5 MG  tablet Take 2.5 mg by mouth daily.    [provider]  meloxicam (MOBIC) 15 MG tablet Take 1 tablet (15 mg total) by mouth daily. 01/07/19   [provider]  metFORMIN (GLUCOPHAGE) 500 MG tablet Take by mouth daily with breakfast.    [provider]  predniSONE (DELTASONE) 5 MG tablet Take 6 pills for first day, 5 pills second day, 4 pills third day, 3 pills fourth day, 2 pills the fifth day, and 1 pill sixth day. 02/05/19   Rosemarie Ax, MD  pregabalin (LYRICA) 200 MG capsule Take by mouth.    [provider]  Prenatal Vit-Iron Carbonyl-FA (PRENATAL PLUS IRON) 29-1 MG TABS Take 1 tablet by mouth daily. 01/20/19   [provider]  promethazine (PHENERGAN) 12.5 MG tablet Take 1 tablet (12.5 mg total) by mouth every 6 (six) hours as needed for up to 30 days for Nausea 12/22/18   [provider]    Family History No family history on file.  Social History Social History   Tobacco Use  . Smoking status: Never Smoker  . Smokeless tobacco: Never Used  Substance Use Topics  . Alcohol use: Not Currently  . Drug use: No     Allergies   Adhesive [tape]  and Cinnamon   Review of Systems Review of Systems  Constitutional: Positive for fatigue. Negative for fever.  Respiratory: Positive for shortness of breath. Negative for cough.   Cardiovascular: Negative for chest pain and leg swelling.  Gastrointestinal: Negative for abdominal pain.  Genitourinary: Positive for vaginal bleeding.  All other systems reviewed and are negative.    Physical Exam Updated Vital Signs BP 124/83   Pulse 88   Temp 98.6 F (37 C) (Oral)   Resp (!) 22   Ht 5\' 3"  (1.6 m)   Wt 125.2 kg   SpO2 100%   BMI 48.89 kg/m   Physical Exam Vitals signs and nursing note reviewed.  Constitutional:      Appearance: She is well-developed. She is obese.  HENT:     Head: Normocephalic and atraumatic.     Right Ear: External ear normal.     Left Ear: External  ear normal.     Nose: Nose normal.  Eyes:     General:        Right eye: No discharge.        Left eye: No discharge.  Cardiovascular:     Rate and Rhythm: Normal rate and regular rhythm.     Heart sounds: Normal heart sounds.  Pulmonary:     Effort: Pulmonary effort is normal. Tachypnea present. No accessory muscle usage.     Breath sounds: Normal breath sounds. No wheezing, rhonchi or rales.  Abdominal:     Palpations: Abdomen is soft.     Tenderness: There is no abdominal tenderness.  Musculoskeletal:     Right lower leg: No edema.     Left lower leg: No edema.  Skin:    General: Skin is warm and dry.  Neurological:     Mental Status: She is alert.  Psychiatric:        Mood and Affect: Mood is not anxious.      ED Treatments / Results  Labs (all labs ordered are listed, but only abnormal results are displayed) Labs Reviewed  COMPREHENSIVE METABOLIC PANEL - Abnormal; Notable for the following components:      Result Value   Glucose, Bld 107 (*)    All other components within normal limits  CBC WITH DIFFERENTIAL/PLATELET - Abnormal; Notable for the following components:   RBC 3.07 (*)    Hemoglobin 6.4 (*)    HCT 22.8 (*)    MCV 74.3 (*)    MCH 20.8 (*)    MCHC 28.1 (*)    RDW 18.2 (*)    Platelets 457 (*)    nRBC 0.5 (*)    Abs Immature Granulocytes 0.13 (*)    All other components within normal limits  SARS CORONAVIRUS 2 (TAT 6-24 HRS)  BRAIN NATRIURETIC PEPTIDE  PREGNANCY, URINE  VITAMIN B12  FOLATE  IRON AND TIBC  FERRITIN  RETICULOCYTES    EKG EKG Interpretation  Date/Time:  Sunday February 22 2019 20:25:14 EDT Ventricular Rate:  85 PR Interval:    QRS Duration: 88 QT Interval:  374 QTC Calculation: 445 R Axis:   46 Text Interpretation:  Sinus rhythm Baseline wander in lead(s) V6 no acute ST/T changes Confirmed by Pricilla LovelessGoldston, Lavaughn Bisig [phone]([id number]) on 02/22/2019 8:28:28 PM   Radiology Dg Chest Portable 1 View  Result Date: 02/22/2019 CLINICAL DATA:   Dyspnea EXAM: PORTABLE CHEST 1 VIEW COMPARISON:  01/04/2014, 12/03/2017 FINDINGS: The heart size and mediastinal contours are within normal limits. Both lungs are clear. The visualized skeletal structures are  unremarkable. IMPRESSION: No active disease. Electronically Signed   By: Jasmine Pang M.D.   On: 02/22/2019 20:45    Procedures Procedures (including critical care time)  Medications Ordered in ED Medications - No data to display   Initial Impression / Assessment and Plan / ED Course  I have reviewed the triage vital signs and the nursing notes.  Pertinent labs & imaging results that were available during my care of the patient were reviewed by me and considered in my medical decision making (see chart for details).        Patient's lab work confirms significant anemia with a hemoglobin of 6.4.  Otherwise blood work is pretty unremarkable.  Patient appears to need blood transfusion though her vital signs are stable.  We only have emergency release blood.  Originally plan to admit to [facility] though patient would like to go to [facility] if possible.  This is where her OB/GYN works.  I discussed with Dr. Stark Bray of OB/GYN at [facility] and he recommends transfer to the ED and gynecologic ultrasound, transfusion and consultation with him.  Discussed with Dr. Littie Deeds, EDP, who accepts in transfer.  She has remained stable.  CHANCE KARAM was evaluated in Emergency Department on 02/22/2019 for the symptoms described in the history of present illness. She was evaluated in the context of the global COVID-19 pandemic, which necessitated consideration that the patient might be at risk for infection with the SARS-CoV-2 virus that causes COVID-19. Institutional protocols and algorithms that pertain to the evaluation of patients at risk for COVID-19 are in a state of rapid change based on information released by regulatory bodies including the CDC and federal and state  organizations. These policies and algorithms were followed during the patient's care in the ED.   Final Clinical Impressions(s) / ED Diagnoses   Final diagnoses:  Symptomatic anemia    ED Discharge Orders    None       Pricilla Loveless, MD 02/22/19 2328

## 2019-02-22 NOTE — ED Notes (Signed)
Lab called with a Hgb of 6.4. Dr. Regenia Skeeter and Orlando Penner, RN aware.

## 2019-02-22 NOTE — ED Notes (Signed)
Contacted [facility] for transport.

## 2019-02-22 NOTE — ED Notes (Signed)
Contacted [facility] line.  They will call back and have hospitalist speak with Dr. Regenia Skeeter

## 2019-02-23 LAB — FOLATE: Folate: 31.1 ng/mL (ref >5.9)

## 2019-02-23 LAB — SARS CORONAVIRUS 2 (TAT 6-24 HRS): SARS Coronavirus 2: NEGATIVE

## 2019-02-23 LAB — IRON AND TIBC
Iron: 50 ug/dL (ref 28–170)
Saturation Ratios: 11 % (ref 10.4–31.8)
TIBC: 461 ug/dL — ABNORMAL HIGH (ref 250–450)
UIBC: 411 ug/dL

## 2019-02-23 LAB — FERRITIN: Ferritin: 3 ng/mL — ABNORMAL LOW (ref 11–307)

## 2019-02-23 LAB — VITAMIN B12: Vitamin B-12: 462 pg/mL (ref 180–914)

## 2019-02-23 NOTE — ED Notes (Signed)
[facility] here for transport.  

## 2019-03-05 ENCOUNTER — Ambulatory Visit: Payer: Medicaid Other | Admitting: Family Medicine

## 2019-08-19 ENCOUNTER — Emergency Department (HOSPITAL_BASED_OUTPATIENT_CLINIC_OR_DEPARTMENT_OTHER): Payer: Medicaid Other

## 2019-08-19 ENCOUNTER — Encounter (HOSPITAL_BASED_OUTPATIENT_CLINIC_OR_DEPARTMENT_OTHER): Payer: Self-pay

## 2019-08-19 ENCOUNTER — Other Ambulatory Visit: Payer: Self-pay

## 2019-08-19 ENCOUNTER — Emergency Department (HOSPITAL_BASED_OUTPATIENT_CLINIC_OR_DEPARTMENT_OTHER)
Admission: EM | Admit: 2019-08-19 | Discharge: 2019-08-19 | Disposition: A | Discharge status: Home / Self Care | Payer: Medicaid Other | Attending: Emergency Medicine | Admitting: Emergency Medicine

## 2019-08-19 DIAGNOSIS — J1282 Pneumonia due to coronavirus disease 2019: Secondary | ICD-10-CM | POA: Insufficient documentation

## 2019-08-19 DIAGNOSIS — M549 Dorsalgia, unspecified: Secondary | ICD-10-CM | POA: Diagnosis not present

## 2019-08-19 DIAGNOSIS — Z79899 Other long term (current) drug therapy: Secondary | ICD-10-CM | POA: Insufficient documentation

## 2019-08-19 DIAGNOSIS — U071 COVID-19: Secondary | ICD-10-CM | POA: Insufficient documentation

## 2019-08-19 DIAGNOSIS — E119 Type 2 diabetes mellitus without complications: Secondary | ICD-10-CM | POA: Insufficient documentation

## 2019-08-19 DIAGNOSIS — I10 Essential (primary) hypertension: Secondary | ICD-10-CM | POA: Diagnosis not present

## 2019-08-19 DIAGNOSIS — Z7984 Long term (current) use of oral hypoglycemic drugs: Secondary | ICD-10-CM | POA: Diagnosis not present

## 2019-08-19 DIAGNOSIS — R509 Fever, unspecified: Secondary | ICD-10-CM | POA: Diagnosis present

## 2019-08-19 DIAGNOSIS — R0789 Other chest pain: Secondary | ICD-10-CM | POA: Diagnosis not present

## 2019-08-19 DIAGNOSIS — R05 Cough: Secondary | ICD-10-CM | POA: Diagnosis not present

## 2019-08-19 LAB — URINALYSIS, ROUTINE W REFLEX MICROSCOPIC
Bilirubin Urine: NEGATIVE
Glucose, UA: NEGATIVE mg/dL
Ketones, ur: NEGATIVE mg/dL
Leukocytes,Ua: NEGATIVE
Nitrite: NEGATIVE
Protein, ur: NEGATIVE mg/dL
Specific Gravity, Urine: 1.02 (ref 1.005–1.030)
pH: 6 (ref 5.0–8.0)

## 2019-08-19 LAB — COMPREHENSIVE METABOLIC PANEL
ALT: 17 U/L (ref 0–44)
AST: 38 U/L (ref 15–41)
Albumin: 3.5 g/dL (ref 3.5–5.0)
Alkaline Phosphatase: 39 U/L (ref 38–126)
Anion gap: 7 (ref 5–15)
BUN: 6 mg/dL (ref 6–20)
CO2: 24 mmol/L (ref 22–32)
Calcium: 8.3 mg/dL — ABNORMAL LOW (ref 8.9–10.3)
Chloride: 103 mmol/L (ref 98–111)
Creatinine, Ser: 1.13 mg/dL — ABNORMAL HIGH (ref 0.44–1.00)
GFR calc Af Amer: >60 mL/min (ref >60)
GFR calc non Af Amer: >60 mL/min (ref >60)
Glucose, Bld: 109 mg/dL — ABNORMAL HIGH (ref 70–99)
Potassium: 3.3 mmol/L — ABNORMAL LOW (ref 3.5–5.1)
Sodium: 134 mmol/L — ABNORMAL LOW (ref 135–145)
Total Bilirubin: 0.4 mg/dL (ref 0.3–1.2)
Total Protein: 7.5 g/dL (ref 6.5–8.1)

## 2019-08-19 LAB — CBC WITH DIFFERENTIAL/PLATELET
Abs Immature Granulocytes: 0.01 K/uL (ref 0.00–0.07)
Basophils Absolute: 0 K/uL (ref 0.0–0.1)
Basophils Relative: 0 %
Eosinophils Absolute: 0 K/uL (ref 0.0–0.5)
Eosinophils Relative: 0 %
HCT: 35.4 % — ABNORMAL LOW (ref 36.0–46.0)
Hemoglobin: 10.8 g/dL — ABNORMAL LOW (ref 12.0–15.0)
Immature Granulocytes: 0 %
Lymphocytes Relative: 28 %
Lymphs Abs: 1.2 K/uL (ref 0.7–4.0)
MCH: 23.4 pg — ABNORMAL LOW (ref 26.0–34.0)
MCHC: 30.5 g/dL (ref 30.0–36.0)
MCV: 76.6 fL — ABNORMAL LOW (ref 80.0–100.0)
Monocytes Absolute: 0.2 K/uL (ref 0.1–1.0)
Monocytes Relative: 5 %
Neutro Abs: 3 K/uL (ref 1.7–7.7)
Neutrophils Relative %: 67 %
Platelets: 242 K/uL (ref 150–400)
RBC: 4.62 MIL/uL (ref 3.87–5.11)
RDW: 16.6 % — ABNORMAL HIGH (ref 11.5–15.5)
WBC: 4.5 K/uL (ref 4.0–10.5)
nRBC: 0 % (ref 0.0–0.2)

## 2019-08-19 LAB — URINALYSIS, MICROSCOPIC (REFLEX)

## 2019-08-19 LAB — HCG, SERUM, QUALITATIVE: Preg, Serum: NEGATIVE

## 2019-08-19 LAB — SARS CORONAVIRUS 2 AG (30 MIN TAT): SARS Coronavirus 2 Ag: POSITIVE — AB

## 2019-08-19 MED ORDER — ACETAMINOPHEN 500 MG PO TABS
1000.0000 mg | ORAL_TABLET | Freq: Once | ORAL | Status: AC
  Administered 2019-08-19: 1000 mg via ORAL
  Filled 2019-08-19: qty 2

## 2019-08-19 MED ORDER — SODIUM CHLORIDE 0.9 % IV BOLUS
1000.0000 mL | Freq: Once | INTRAVENOUS | Status: AC
  Administered 2019-08-19: 21:00:00 1000 mL via INTRAVENOUS

## 2019-08-19 NOTE — ED Triage Notes (Signed)
Pt c/o lower back pain, HA, cough x 5 days-last tylenol 2 hours ago-n/v x today-to triage in w/c

## 2019-08-19 NOTE — Discharge Instructions (Addendum)
A referral has been made to the monoclonal antibody infusion clinic.  They should be in touch with you tomorrow about possibly receiving this therapy.  For now, take Tylenol 1000 mg rotated with ibuprofen 600 mg every 4 hours as needed for fever.  Drink plenty of fluids and get plenty of rest.  Isolate at home until you feel better +1-week.      Person Under Monitoring Name: Colleen Schultz  Location: [place] [id number]   Infection Prevention Recommendations for Individuals Confirmed to have, or Being Evaluated for, 2019 Novel Coronavirus (COVID-19) Infection Who Receive Care at Home  Individuals who are confirmed to have, or are being evaluated for, COVID-19 should follow the prevention steps below until a healthcare provider or local or state health department says they can return to normal activities.  Stay home except to get medical care You should restrict activities outside your home, except for getting medical care. Do not go to work, school, or public areas, and do not use public transportation or taxis.  Call ahead before visiting your doctor Before your medical appointment, call the healthcare provider and tell them that you have, or are being evaluated for, COVID-19 infection. This will help the healthcare provider's office take steps to keep other people from getting infected. Ask your healthcare provider to call the local or state health department.  Monitor your symptoms Seek prompt medical attention if your illness is worsening (e.g., difficulty breathing). Before going to your medical appointment, call the healthcare provider and tell them that you have, or are being evaluated for, COVID-19 infection. Ask your healthcare provider to call the local or state health department.  Wear a facemask You should wear a facemask that covers your nose and mouth when you are in the same room with other people and when you visit a healthcare provider.  People who live with or visit you should also wear a facemask while they are in the same room with you.  Separate yourself from other people in your home As much as possible, you should stay in a different room from other people in your home. Also, you should use a separate bathroom, if available.  Avoid sharing household items You should not share dishes, drinking glasses, cups, eating utensils, towels, bedding, or other items with other people in your home. After using these items, you should wash them thoroughly with soap and water.  Cover your coughs and sneezes Cover your mouth and nose with a tissue when you cough or sneeze, or you can cough or sneeze into your sleeve. Throw used tissues in a lined trash can, and immediately wash your hands with soap and water for at least 20 seconds or use an alcohol-based hand rub.  Wash your [facility] your hands often and thoroughly with soap and water for at least 20 seconds. You can use an alcohol-based hand sanitizer if soap and water are not available and if your hands are not visibly dirty. Avoid touching your eyes, nose, and mouth with unwashed hands.   Prevention Steps for Caregivers and Household Members of Individuals Confirmed to have, or Being Evaluated for, COVID-19 Infection Being Cared for in the Home  If you live with, or provide care at home for, a person confirmed to have, or being evaluated for, COVID-19 infection please follow these guidelines to prevent infection:  Follow healthcare provider's instructions Make sure that you understand and can help the patient follow any healthcare provider instructions for all  care.  Provide for the patient's basic needs You should help the patient with basic needs in the home and provide support for getting groceries, prescriptions, and other personal needs.  Monitor the patient's symptoms If they are getting sicker, call his or her medical provider and tell them that the patient  has, or is being evaluated for, COVID-19 infection. This will help the healthcare provider's office take steps to keep other people from getting infected. Ask the healthcare provider to call the local or state health department.  Limit the number of people who have contact with the patient If possible, have only one caregiver for the patient. Other household members should stay in another home or place of residence. If this is not possible, they should stay in another room, or be separated from the patient as much as possible. Use a separate bathroom, if available. Restrict visitors who do not have an essential need to be in the home.  Keep older adults, very young children, and other sick people away from the patient Keep older adults, very young children, and those who have compromised immune systems or chronic health conditions away from the patient. This includes people with chronic heart, lung, or kidney conditions, diabetes, and cancer.  Ensure good ventilation Make sure that shared spaces in the home have good air flow, such as from an air conditioner or an opened window, weather permitting.  Wash your hands often Wash your hands often and thoroughly with soap and water for at least 20 seconds. You can use an alcohol based hand sanitizer if soap and water are not available and if your hands are not visibly dirty. Avoid touching your eyes, nose, and mouth with unwashed hands. Use disposable paper towels to dry your hands. If not available, use dedicated cloth towels and replace them when they become wet.  Wear a facemask and gloves Wear a disposable facemask at all times in the room and gloves when you touch or have contact with the patient's blood, body fluids, and/or secretions or excretions, such as sweat, saliva, sputum, nasal mucus, vomit, urine, or feces.  Ensure the mask fits over your nose and mouth tightly, and do not touch it during use. Throw out disposable facemasks and  gloves after using them. Do not reuse. Wash your hands immediately after removing your facemask and gloves. If your personal clothing becomes contaminated, carefully remove clothing and launder. Wash your hands after handling contaminated clothing. Place all used disposable facemasks, gloves, and other waste in a lined container before disposing them with other household waste. Remove gloves and wash your hands immediately after handling these items.  Do not share dishes, glasses, or other household items with the patient Avoid sharing household items. You should not share dishes, drinking glasses, cups, eating utensils, towels, bedding, or other items with a patient who is confirmed to have, or being evaluated for, COVID-19 infection. After the person uses these items, you should wash them thoroughly with soap and water.  Wash laundry thoroughly Immediately remove and wash clothes or bedding that have blood, body fluids, and/or secretions or excretions, such as sweat, saliva, sputum, nasal mucus, vomit, urine, or feces, on them. Wear gloves when handling laundry from the patient. Read and follow directions on labels of laundry or clothing items and detergent. In general, wash and dry with the warmest temperatures recommended on the label.  Clean all areas the individual has used often Clean all touchable surfaces, such as counters, tabletops, doorknobs, bathroom fixtures, toilets, phones,  keyboards, tablets, and bedside tables, every day. Also, clean any surfaces that may have blood, body fluids, and/or secretions or excretions on them. Wear gloves when cleaning surfaces the patient has come in contact with. Use a diluted bleach solution (e.g., dilute bleach with 1 part bleach and 10 parts water) or a household disinfectant with a label that says EPA-registered for coronaviruses. To make a bleach solution at home, add 1 tablespoon of bleach to 1 quart (4 cups) of water. For a larger supply, add   cup of bleach to 1 gallon (16 cups) of water. Read labels of cleaning products and follow recommendations provided on product labels. Labels contain instructions for safe and effective use of the cleaning product including precautions you should take when applying the product, such as wearing gloves or eye protection and making sure you have good ventilation during use of the product. Remove gloves and wash hands immediately after cleaning.  Monitor yourself for signs and symptoms of illness Caregivers and household members are considered close contacts, should monitor their health, and will be asked to limit movement outside of the home to the extent possible. Follow the monitoring steps for close contacts listed on the symptom monitoring form.   ? If you have additional questions, contact your local health department or call the epidemiologist on call at [phone] (available 24/7). ? This guidance is subject to change. For the most up-to-date guidance from [facility], please refer to their website: [email or web address]

## 2019-08-19 NOTE — ED Notes (Signed)
PT tolerated po water, no emesis.  

## 2019-08-19 NOTE — ED Provider Notes (Signed)
[place] [facility] Provider Note   CSN: [id number] Arrival date & time: 08/19/19  2010     History Chief Complaint  Patient presents with  . Back Pain    Colleen ROTOLO is a 34 y.o. female.  Patient is a 34 year old female with history of diabetes, hypertension.  She presents today for evaluation of fever, cough, and back pain.  This has been worsening over the past 5 days.  She has had temperature to 101 at home.  Cough is intermittently productive.  She denies shortness of breath, but does feel somewhat tight in the chest.  Patient denies any ill contacts or exposures that she knows of to Covid.  The history is provided by the patient.       Past Medical History:  Diagnosis Date  . Anemia   . Blood transfusion without reported diagnosis   . Diabetes mellitus without complication ([place])   . Hypertension   . Obesity   . Thyroid disease     Patient Active Problem List   Diagnosis Date Noted  . Acute pain of right shoulder 02/05/2019    Past Surgical History:  Procedure Laterality Date  . CARPAL TUNNEL RELEASE    . CESAREAN SECTION    . LEEP    . THYROID SURGERY       OB History   No obstetric history on file.     No family history on file.  Social History   Tobacco Use  . Smoking status: Never Smoker  . Smokeless tobacco: Never Used  Substance Use Topics  . Alcohol use: Not Currently  . Drug use: No    Home Medications Prior to Admission medications   Medication Sig Start Date End Date Taking? Authorizing Provider  chlorhexidine (HIBICLENS) 4 % external liquid Apply topically daily as needed. 02/09/19   Horton, Barbette Hair, MD  doxycycline (VIBRAMYCIN) 100 MG capsule Take 1 capsule (100 mg total) by mouth 2 (two) times daily. 02/09/19   Horton, Barbette Hair, MD  Iron-FA-B Cmp-C-Biot-Probiotic (FUSION PLUS PO) Take 1 capsule by mouth 3 (three) times daily.    [provider]  lisinopril (PRINIVIL,ZESTRIL) 2.5 MG tablet Take  2.5 mg by mouth daily.    [provider]  meloxicam (MOBIC) 15 MG tablet Take 1 tablet (15 mg total) by mouth daily. 01/07/19   [provider]  metFORMIN (GLUCOPHAGE) 500 MG tablet Take by mouth daily with breakfast.    [provider]  predniSONE (DELTASONE) 5 MG tablet Take 6 pills for first day, 5 pills second day, 4 pills third day, 3 pills fourth day, 2 pills the fifth day, and 1 pill sixth day. 02/05/19   Rosemarie Ax, MD  pregabalin (LYRICA) 200 MG capsule Take by mouth.    [provider]  Prenatal Vit-Iron Carbonyl-FA (PRENATAL PLUS IRON) 29-1 MG TABS Take 1 tablet by mouth daily. 01/20/19   [provider]  promethazine (PHENERGAN) 12.5 MG tablet Take 1 tablet (12.5 mg total) by mouth every 6 (six) hours as needed for up to 30 days for Nausea 12/22/18   [provider]    Allergies    Adhesive [tape] and Cinnamon  Review of Systems   Review of Systems  All other systems reviewed and are negative.   Physical Exam Updated Vital Signs BP 130/90 (BP Location: Right Arm)   Pulse (!) 106   Temp (!) 103.2 F (39.6 C) (Oral)   Resp 18   Ht 5\' 2"  (  1.575 m)   Wt 125.2 kg   LMP 08/19/2019   SpO2 95%   BMI 50.48 kg/m   Physical Exam Vitals and nursing note reviewed.  Constitutional:      General: She is not in acute distress.    Appearance: She is well-developed. She is not diaphoretic.  HENT:     Head: Normocephalic and atraumatic.  Cardiovascular:     Rate and Rhythm: Normal rate and regular rhythm.     Heart sounds: No murmur. No friction rub. No gallop.   Pulmonary:     Effort: Pulmonary effort is normal. No respiratory distress.     Breath sounds: Normal breath sounds. No wheezing.  Abdominal:     General: Bowel sounds are normal. There is no distension.     Palpations: Abdomen is soft.     Tenderness: There is no abdominal tenderness.  Musculoskeletal:        General: Normal range of motion.     Cervical  back: Normal range of motion and neck supple.  Skin:    General: Skin is warm and dry.  Neurological:     Mental Status: She is alert and oriented to person, place, and time.     ED Results / Procedures / Treatments   Labs (all labs ordered are listed, but only abnormal results are displayed) Labs Reviewed  SARS CORONAVIRUS 2 AG (30 MIN TAT)  COMPREHENSIVE METABOLIC PANEL  CBC WITH DIFFERENTIAL/PLATELET  URINALYSIS, ROUTINE W REFLEX MICROSCOPIC  HCG, SERUM, QUALITATIVE    EKG None  Radiology No results found.  Procedures Procedures (including critical care time)  Medications Ordered in ED Medications  sodium chloride 0.9 % bolus 1,000 mL (has no administration in time range)  acetaminophen (TYLENOL) tablet 1,000 mg (has no administration in time range)    ED Course  I have reviewed the triage vital signs and the nursing notes.  Pertinent labs & imaging results that were available during my care of the patient were reviewed by me and considered in my medical decision making (see chart for details).    MDM Rules/Calculators/A&P  Patient presenting with complaints of fever, cough, and back pain for the past several days.  Patient's work-up shows unremarkable laboratory studies, but Covid swab is positive.  Chest x-ray is consistent with viral pneumonia.  Patient's vital signs are stable with normal oxygen saturations.  She is in no respiratory distress.  I do not feel as though the patient requires admission.  I will make a referral to the monoclonal antibody clinic as she has comorbidities and may benefit from this therapy.  Final Clinical Impression(s) / ED Diagnoses Final diagnoses:  None    Rx / DC Orders ED Discharge Orders    None       Geoffery Lyons, MD 08/19/19 2244

## 2019-08-20 ENCOUNTER — Telehealth: Payer: Self-pay | Admitting: Adult Health

## 2019-08-20 NOTE — Telephone Encounter (Signed)
Error

## 2019-08-21 ENCOUNTER — Other Ambulatory Visit: Payer: Self-pay

## 2019-08-21 DIAGNOSIS — U071 COVID-19: Principal | ICD-10-CM | POA: Diagnosis present

## 2019-08-21 DIAGNOSIS — Z7984 Long term (current) use of oral hypoglycemic drugs: Secondary | ICD-10-CM

## 2019-08-21 DIAGNOSIS — T380X5A Adverse effect of glucocorticoids and synthetic analogues, initial encounter: Secondary | ICD-10-CM | POA: Diagnosis not present

## 2019-08-21 DIAGNOSIS — R300 Dysuria: Secondary | ICD-10-CM | POA: Diagnosis present

## 2019-08-21 DIAGNOSIS — D509 Iron deficiency anemia, unspecified: Secondary | ICD-10-CM | POA: Diagnosis present

## 2019-08-21 DIAGNOSIS — K219 Gastro-esophageal reflux disease without esophagitis: Secondary | ICD-10-CM | POA: Diagnosis present

## 2019-08-21 DIAGNOSIS — I10 Essential (primary) hypertension: Secondary | ICD-10-CM | POA: Diagnosis present

## 2019-08-21 DIAGNOSIS — Z79899 Other long term (current) drug therapy: Secondary | ICD-10-CM

## 2019-08-21 DIAGNOSIS — Z6841 Body Mass Index (BMI) 40.0 and over, adult: Secondary | ICD-10-CM

## 2019-08-21 DIAGNOSIS — Z791 Long term (current) use of non-steroidal anti-inflammatories (NSAID): Secondary | ICD-10-CM

## 2019-08-21 DIAGNOSIS — J1282 Pneumonia due to coronavirus disease 2019: Secondary | ICD-10-CM | POA: Diagnosis present

## 2019-08-21 DIAGNOSIS — Z87440 Personal history of urinary (tract) infections: Secondary | ICD-10-CM

## 2019-08-21 DIAGNOSIS — E119 Type 2 diabetes mellitus without complications: Secondary | ICD-10-CM | POA: Diagnosis present

## 2019-08-21 DIAGNOSIS — R112 Nausea with vomiting, unspecified: Secondary | ICD-10-CM | POA: Diagnosis present

## 2019-08-21 DIAGNOSIS — N926 Irregular menstruation, unspecified: Secondary | ICD-10-CM | POA: Diagnosis present

## 2019-08-21 DIAGNOSIS — D72829 Elevated white blood cell count, unspecified: Secondary | ICD-10-CM | POA: Diagnosis not present

## 2019-08-21 DIAGNOSIS — J9601 Acute respiratory failure with hypoxia: Secondary | ICD-10-CM | POA: Diagnosis present

## 2019-08-22 ENCOUNTER — Emergency Department (HOSPITAL_BASED_OUTPATIENT_CLINIC_OR_DEPARTMENT_OTHER): Payer: Medicaid Other

## 2019-08-22 ENCOUNTER — Inpatient Hospital Stay (HOSPITAL_BASED_OUTPATIENT_CLINIC_OR_DEPARTMENT_OTHER)
Admission: EM | Admit: 2019-08-22 | Discharge: 2019-08-31 | DRG: 177 | Disposition: A | Discharge status: Home / Self Care | Payer: Medicaid Other | Attending: Internal Medicine | Admitting: Internal Medicine

## 2019-08-22 ENCOUNTER — Encounter (HOSPITAL_BASED_OUTPATIENT_CLINIC_OR_DEPARTMENT_OTHER): Payer: Self-pay | Admitting: Emergency Medicine

## 2019-08-22 ENCOUNTER — Other Ambulatory Visit: Payer: Self-pay

## 2019-08-22 DIAGNOSIS — K219 Gastro-esophageal reflux disease without esophagitis: Secondary | ICD-10-CM | POA: Diagnosis present

## 2019-08-22 DIAGNOSIS — J069 Acute upper respiratory infection, unspecified: Secondary | ICD-10-CM | POA: Diagnosis present

## 2019-08-22 DIAGNOSIS — N926 Irregular menstruation, unspecified: Secondary | ICD-10-CM | POA: Diagnosis present

## 2019-08-22 DIAGNOSIS — E119 Type 2 diabetes mellitus without complications: Secondary | ICD-10-CM

## 2019-08-22 DIAGNOSIS — R300 Dysuria: Secondary | ICD-10-CM | POA: Diagnosis present

## 2019-08-22 DIAGNOSIS — J9601 Acute respiratory failure with hypoxia: Secondary | ICD-10-CM

## 2019-08-22 DIAGNOSIS — D649 Anemia, unspecified: Secondary | ICD-10-CM | POA: Diagnosis not present

## 2019-08-22 DIAGNOSIS — Z79899 Other long term (current) drug therapy: Secondary | ICD-10-CM | POA: Diagnosis not present

## 2019-08-22 DIAGNOSIS — U071 COVID-19: Principal | ICD-10-CM | POA: Diagnosis present

## 2019-08-22 DIAGNOSIS — Z6841 Body Mass Index (BMI) 40.0 and over, adult: Secondary | ICD-10-CM | POA: Diagnosis not present

## 2019-08-22 DIAGNOSIS — J1282 Pneumonia due to coronavirus disease 2019: Secondary | ICD-10-CM | POA: Diagnosis not present

## 2019-08-22 DIAGNOSIS — Z7984 Long term (current) use of oral hypoglycemic drugs: Secondary | ICD-10-CM | POA: Diagnosis not present

## 2019-08-22 DIAGNOSIS — Z87440 Personal history of urinary (tract) infections: Secondary | ICD-10-CM | POA: Diagnosis not present

## 2019-08-22 DIAGNOSIS — T380X5A Adverse effect of glucocorticoids and synthetic analogues, initial encounter: Secondary | ICD-10-CM | POA: Diagnosis not present

## 2019-08-22 DIAGNOSIS — R112 Nausea with vomiting, unspecified: Secondary | ICD-10-CM | POA: Diagnosis present

## 2019-08-22 DIAGNOSIS — Z791 Long term (current) use of non-steroidal anti-inflammatories (NSAID): Secondary | ICD-10-CM | POA: Diagnosis not present

## 2019-08-22 DIAGNOSIS — I10 Essential (primary) hypertension: Secondary | ICD-10-CM | POA: Diagnosis present

## 2019-08-22 DIAGNOSIS — D509 Iron deficiency anemia, unspecified: Secondary | ICD-10-CM | POA: Diagnosis present

## 2019-08-22 DIAGNOSIS — D72829 Elevated white blood cell count, unspecified: Secondary | ICD-10-CM | POA: Diagnosis not present

## 2019-08-22 LAB — COMPREHENSIVE METABOLIC PANEL
ALT: 27 U/L (ref 0–44)
AST: 73 U/L — ABNORMAL HIGH (ref 15–41)
Albumin: 3.4 g/dL — ABNORMAL LOW (ref 3.5–5.0)
Alkaline Phosphatase: 34 U/L — ABNORMAL LOW (ref 38–126)
Anion gap: 9 (ref 5–15)
BUN: 5 mg/dL — ABNORMAL LOW (ref 6–20)
CO2: 24 mmol/L (ref 22–32)
Calcium: 8 mg/dL — ABNORMAL LOW (ref 8.9–10.3)
Chloride: 108 mmol/L (ref 98–111)
Creatinine, Ser: 0.94 mg/dL (ref 0.44–1.00)
GFR calc Af Amer: >60 mL/min (ref >60)
GFR calc non Af Amer: >60 mL/min (ref >60)
Glucose, Bld: 106 mg/dL — ABNORMAL HIGH (ref 70–99)
Potassium: 3.8 mmol/L (ref 3.5–5.1)
Sodium: 141 mmol/L (ref 135–145)
Total Bilirubin: 0.7 mg/dL (ref 0.3–1.2)
Total Protein: 7.6 g/dL (ref 6.5–8.1)

## 2019-08-22 LAB — CBC WITH DIFFERENTIAL/PLATELET
Abs Immature Granulocytes: 0.02 10*3/uL (ref 0.00–0.07)
Basophils Absolute: 0 10*3/uL (ref 0.0–0.1)
Basophils Relative: 0 %
Eosinophils Absolute: 0 10*3/uL (ref 0.0–0.5)
Eosinophils Relative: 0 %
HCT: 35.3 % — ABNORMAL LOW (ref 36.0–46.0)
Hemoglobin: 10.5 g/dL — ABNORMAL LOW (ref 12.0–15.0)
Immature Granulocytes: 0 %
Lymphocytes Relative: 20 %
Lymphs Abs: 1.2 10*3/uL (ref 0.7–4.0)
MCH: 23.1 pg — ABNORMAL LOW (ref 26.0–34.0)
MCHC: 29.7 g/dL — ABNORMAL LOW (ref 30.0–36.0)
MCV: 77.8 fL — ABNORMAL LOW (ref 80.0–100.0)
Monocytes Absolute: 0.1 10*3/uL (ref 0.1–1.0)
Monocytes Relative: 2 %
Neutro Abs: 4.5 10*3/uL (ref 1.7–7.7)
Neutrophils Relative %: 78 %
Platelets: 222 10*3/uL (ref 150–400)
RBC: 4.54 MIL/uL (ref 3.87–5.11)
RDW: 16.6 % — ABNORMAL HIGH (ref 11.5–15.5)
WBC: 5.9 10*3/uL (ref 4.0–10.5)
nRBC: 0 % (ref 0.0–0.2)

## 2019-08-22 LAB — CBG MONITORING, ED: Glucose-Capillary: 107 mg/dL — ABNORMAL HIGH (ref 70–99)

## 2019-08-22 LAB — FIBRINOGEN: Fibrinogen: 541 mg/dL — ABNORMAL HIGH (ref 210–475)

## 2019-08-22 LAB — D-DIMER, QUANTITATIVE: D-Dimer, Quant: 1.27 ug/mL-FEU — ABNORMAL HIGH (ref 0.00–0.50)

## 2019-08-22 LAB — TRIGLYCERIDES: Triglycerides: 97 mg/dL (ref <150)

## 2019-08-22 LAB — GLUCOSE, CAPILLARY: Glucose-Capillary: 132 mg/dL — ABNORMAL HIGH (ref 70–99)

## 2019-08-22 LAB — LACTIC ACID, PLASMA
Lactic Acid, Venous: 1.3 mmol/L (ref 0.5–1.9)
Lactic Acid, Venous: 1.3 mmol/L (ref 0.5–1.9)

## 2019-08-22 LAB — FERRITIN: Ferritin: 44 ng/mL (ref 11–307)

## 2019-08-22 LAB — LACTATE DEHYDROGENASE: LDH: 444 U/L — ABNORMAL HIGH (ref 98–192)

## 2019-08-22 LAB — C-REACTIVE PROTEIN: CRP: 10.5 mg/dL — ABNORMAL HIGH (ref <1.0)

## 2019-08-22 LAB — PROCALCITONIN: Procalcitonin: <0.1 ng/mL

## 2019-08-22 MED ORDER — ACETAMINOPHEN 325 MG PO TABS: 650.0000 mg | ORAL_TABLET | Freq: Four times a day (QID) | ORAL | Status: DC | PRN

## 2019-08-22 MED ORDER — SODIUM CHLORIDE 0.9% FLUSH
3.0000 mL | Freq: Two times a day (BID) | INTRAVENOUS | Status: DC
  Administered 2019-08-22 – 2019-08-30 (×15): 3 mL via INTRAVENOUS

## 2019-08-22 MED ORDER — ONDANSETRON HCL 4 MG/2ML IJ SOLN
4.0000 mg | Freq: Once | INTRAMUSCULAR | Status: AC
  Administered 2019-08-22: 4 mg via INTRAVENOUS
  Filled 2019-08-22: qty 2

## 2019-08-22 MED ORDER — ENOXAPARIN SODIUM 40 MG/0.4ML ~~LOC~~ SOLN
40.0000 mg | SUBCUTANEOUS | Status: DC
  Administered 2019-08-22: 40 mg via SUBCUTANEOUS
  Filled 2019-08-22: qty 0.4

## 2019-08-22 MED ORDER — GUAIFENESIN 100 MG/5ML PO SOLN
5.0000 mL | Freq: Once | ORAL | Status: AC
  Administered 2019-08-22: 100 mg via ORAL
  Filled 2019-08-22: qty 10

## 2019-08-22 MED ORDER — GUAIFENESIN-DM 100-10 MG/5ML PO SYRP
10.0000 mL | ORAL_SOLUTION | ORAL | Status: DC | PRN
  Administered 2019-08-22 – 2019-08-30 (×10): 10 mL via ORAL
  Filled 2019-08-22 (×10): qty 10

## 2019-08-22 MED ORDER — SODIUM CHLORIDE 0.9 % IV SOLN
100.0000 mg | Freq: Every day | INTRAVENOUS | Status: AC
  Administered 2019-08-23 – 2019-08-26 (×4): 100 mg via INTRAVENOUS
  Filled 2019-08-22 (×4): qty 20

## 2019-08-22 MED ORDER — FENTANYL CITRATE (PF) 100 MCG/2ML IJ SOLN
100.0000 ug | Freq: Once | INTRAMUSCULAR | Status: AC
  Administered 2019-08-22: 100 ug via INTRAVENOUS
  Filled 2019-08-22: qty 2

## 2019-08-22 MED ORDER — LINAGLIPTIN 5 MG PO TABS
5.0000 mg | ORAL_TABLET | Freq: Every day | ORAL | Status: DC
  Administered 2019-08-23 – 2019-08-31 (×9): 5 mg via ORAL
  Filled 2019-08-22 (×9): qty 1

## 2019-08-22 MED ORDER — INSULIN ASPART 100 UNIT/ML ~~LOC~~ SOLN
0.0000 [IU] | Freq: Three times a day (TID) | SUBCUTANEOUS | Status: DC
  Administered 2019-08-23 (×3): 1 [IU] via SUBCUTANEOUS
  Administered 2019-08-24: 2 [IU] via SUBCUTANEOUS
  Administered 2019-08-24 – 2019-08-26 (×4): 1 [IU] via SUBCUTANEOUS
  Administered 2019-08-27: 13:00:00 2 [IU] via SUBCUTANEOUS
  Administered 2019-08-28 – 2019-08-29 (×4): 1 [IU] via SUBCUTANEOUS
  Administered 2019-08-29 – 2019-08-30 (×3): 2 [IU] via SUBCUTANEOUS
  Administered 2019-08-31: 1 [IU] via SUBCUTANEOUS

## 2019-08-22 MED ORDER — DEXAMETHASONE SODIUM PHOSPHATE 10 MG/ML IJ SOLN
6.0000 mg | Freq: Once | INTRAMUSCULAR | Status: AC
  Administered 2019-08-22: 6 mg via INTRAVENOUS
  Filled 2019-08-22: qty 1

## 2019-08-22 MED ORDER — DEXAMETHASONE SODIUM PHOSPHATE 10 MG/ML IJ SOLN
6.0000 mg | INTRAMUSCULAR | Status: DC
  Administered 2019-08-23 – 2019-08-30 (×8): 6 mg via INTRAVENOUS
  Filled 2019-08-22 (×8): qty 1

## 2019-08-22 MED ORDER — ACETAMINOPHEN 325 MG PO TABS
650.0000 mg | ORAL_TABLET | Freq: Once | ORAL | Status: AC
  Administered 2019-08-22: 650 mg via ORAL
  Filled 2019-08-22: qty 2

## 2019-08-22 MED ORDER — SENNOSIDES-DOCUSATE SODIUM 8.6-50 MG PO TABS
1.0000 | ORAL_TABLET | Freq: Every evening | ORAL | Status: DC | PRN
  Administered 2019-08-27: 1 via ORAL
  Filled 2019-08-22: qty 1

## 2019-08-22 MED ORDER — ONDANSETRON HCL 4 MG PO TABS: 4.0000 mg | ORAL_TABLET | Freq: Four times a day (QID) | ORAL | Status: DC | PRN

## 2019-08-22 MED ORDER — HYDROCODONE-ACETAMINOPHEN 5-325 MG PO TABS
1.0000 | ORAL_TABLET | Freq: Four times a day (QID) | ORAL | Status: DC | PRN
  Administered 2019-08-22: 2 via ORAL
  Administered 2019-08-23: 1 via ORAL
  Administered 2019-08-24: 2 via ORAL
  Administered 2019-08-24 – 2019-08-28 (×5): 1 via ORAL
  Administered 2019-08-29 – 2019-08-30 (×2): 2 via ORAL
  Filled 2019-08-22: qty 2
  Filled 2019-08-22: qty 1
  Filled 2019-08-22 (×3): qty 2
  Filled 2019-08-22 (×5): qty 1

## 2019-08-22 MED ORDER — HYDROCOD POLST-CPM POLST ER 10-8 MG/5ML PO SUER
5.0000 mL | Freq: Once | ORAL | Status: AC
  Administered 2019-08-22: 5 mL via ORAL
  Filled 2019-08-22: qty 5

## 2019-08-22 MED ORDER — SODIUM CHLORIDE 0.9 % IV SOLN
100.0000 mg | INTRAVENOUS | Status: AC
  Administered 2019-08-22 (×2): 100 mg via INTRAVENOUS
  Filled 2019-08-22 (×2): qty 20

## 2019-08-22 MED ORDER — SODIUM CHLORIDE 0.9 % IV BOLUS
1000.0000 mL | Freq: Once | INTRAVENOUS | Status: AC
  Administered 2019-08-22: 1000 mL via INTRAVENOUS

## 2019-08-22 MED ORDER — ONDANSETRON HCL 4 MG/2ML IJ SOLN: 4.0000 mg | Freq: Four times a day (QID) | INTRAMUSCULAR | Status: DC | PRN

## 2019-08-22 NOTE — Plan of Care (Signed)
  Problem: Education: Goal: Knowledge of risk factors and measures for prevention of condition will improve Outcome: Progressing   Problem: Coping: Goal: Psychosocial and spiritual needs will be supported Outcome: Progressing   Problem: Respiratory: Goal: Will maintain a patent airway Outcome: Progressing   

## 2019-08-22 NOTE — H&P (Signed)
History and Physical    ADRINA ARMIJO MBP:[id number] DOB: 06/23/1985 DOA: 08/22/2019  PCP: [facility]   Patient coming from: Home   Chief Complaint: Aches, cough, SOB, fever/chills, N/V   HPI: Colleen Schultz is a 34 y.o. female with medical history significant for hypertension, type 2 diabetes mellitus, BMI 50, and hidradenitis, now presenting to the emergency department with aches, cough, fevers, chills, nausea, vomiting, and progressive shortness of breath.  Symptoms began around 08/14/2019, she was seen in the emergency department on 08/19/2019 and diagnosed with COVID-19 at that time with viral pneumonia type findings on chest x-ray, but was stable for discharge home then.  She has since experienced progressive worsening and returns to the ED. She has also developed some nausea and loose stools now without abdominal pain. She continues to have general aches and subjective fevers. Her cough persists and is non-productive. She has been using Tylenol at home.   [facility] ED Course: Upon arrival to the ED, patient is found to be afebrile, requiring 3 L/min of supplemental oxygen, tachypneic, and with stable blood pressure.  EKG features sinus rhythm and chest x-ray with patchy bilateral airspace opacities and consolidations.  Chemistry panel with mild elevation in AST.  CBC with stable chronic microcytic anemia.  D-dimer elevated to 1.27 and CRP elevated to 10.5.  Procalcitonin is undetectable.  Patient was started on supplemental oxygen and treated with a liter of normal saline, Decadron, remdesivir, fentanyl, acetaminophen, cough syrup, and Zofran.  Review of Systems:  All other systems reviewed and apart from HPI, are negative.  Past Medical History:  Diagnosis Date  . Anemia   . Blood transfusion without reported diagnosis   . Diabetes mellitus without complication ([place])   . Hypertension   . Obesity   . Thyroid disease     Past Surgical History:  Procedure  Laterality Date  . CARPAL TUNNEL RELEASE    . CESAREAN SECTION    . LEEP    . THYROID SURGERY       reports that she has never smoked. She has never used smokeless tobacco. She reports previous alcohol use. She reports that she does not use drugs.  Allergies  Allergen Reactions  . Adhesive [Tape] Itching  . Cinnamon Hives    No family history on file.   Prior to Admission medications   Medication Sig Start Date End Date Taking? Authorizing Provider  letrozole [facility] 2.5 MG tablet Take 1 tablet on days 5,6,7,8,9 of cycle 07/24/19  Yes [provider]  medroxyPROGESTERone (PROVERA) 10 MG tablet Take 2 tabs BY MOUTH EVERY MORNING for 5 days to start period 07/24/19  Yes [provider]  chlorhexidine (HIBICLENS) 4 % external liquid Apply topically daily as needed. 02/09/19   Horton, Barbette Hair, MD  doxycycline (VIBRAMYCIN) 100 MG capsule Take 1 capsule (100 mg total) by mouth 2 (two) times daily. 02/09/19   Horton, Barbette Hair, MD  Iron-FA-B Cmp-C-Biot-Probiotic (FUSION PLUS PO) Take 1 capsule by mouth 3 (three) times daily.    [provider]  labetalol (NORMODYNE) 100 MG tablet Take 100 mg by mouth 2 (two) times daily. 07/24/19   [provider]  lisinopril (PRINIVIL,ZESTRIL) 2.5 MG tablet Take 2.5 mg by mouth daily.    [provider]  meloxicam (MOBIC) 15 MG tablet Take 1 tablet (15 mg total) by mouth daily. 01/07/19   [provider]  metFORMIN (GLUCOPHAGE) 500 MG tablet Take by mouth daily with breakfast.  [provider]  predniSONE (DELTASONE) 5 MG tablet Take 6 pills for first day, 5 pills second day, 4 pills third day, 3 pills fourth day, 2 pills the fifth day, and 1 pill sixth day. 02/05/19   Rosemarie Ax, MD  pregabalin (LYRICA) 200 MG capsule Take by mouth.    [provider]  Prenatal Vit-Iron Carbonyl-FA (PRENATAL PLUS IRON) 29-1 MG TABS Take 1 tablet by mouth daily. 01/20/19   [provider]   promethazine (PHENERGAN) 12.5 MG tablet Take 1 tablet (12.5 mg total) by mouth every 6 (six) hours as needed for up to 30 days for Nausea 12/22/18   [provider]    Physical Exam: Vitals:   08/22/19 1700 08/22/19 1715 08/22/19 1730 08/22/19 1912  BP: 111/73 111/73 130/80 127/73  Pulse: 64 66 66 63  Resp: (!) 27 (!) 26 16 18   Temp: 98.8 F (37.1 C)   98.1 F (36.7 C)  TempSrc:    Oral  SpO2: 97% 97% 95% 97%  Weight:      Height:         Constitutional: NAD, calm  Eyes: PERTLA, lids and conjunctivae normal ENMT: Mucous membranes are moist. Posterior pharynx clear of any exudate or lesions.   Neck: normal, supple, no masses, no thyromegaly Respiratory:  no wheezing, no crackles. No accessory muscle use.  Cardiovascular: S1 & S2 heard, regular rate and rhythm. No extremity edema.   Abdomen: No distension, no tenderness, soft. Bowel sounds active.  Musculoskeletal: no clubbing / cyanosis. No joint deformity upper and lower extremities.   Skin: no significant rashes, lesions, ulcers. Warm, dry, well-perfused. Neurologic: CN 2-12 grossly intact. Sensation intact. Moving all extremities.  Psychiatric: Alert and oriented x 3. Pleasant and cooperative.    Labs and Imaging on Admission: I have personally reviewed following labs and imaging studies  CBC: Recent Labs  Lab 08/19/19 2108 08/22/19 0024  WBC 4.5 5.9  NEUTROABS 3.0 4.5  HGB 10.8* 10.5*  HCT 35.4* 35.3*  MCV 76.6* 77.8*  PLT 242 219   Basic Metabolic Panel: Recent Labs  Lab 08/19/19 2108 08/22/19 0024  NA 134* 141  K 3.3* 3.8  CL 103 108  CO2 24 24  GLUCOSE 109* 106*  BUN 6 5*  CREATININE 1.13* 0.94  CALCIUM 8.3* 8.0*   GFR: Estimated Creatinine Clearance: 108.6 mL/min (by C-G formula based on SCr of 0.94 mg/dL). Liver Function Tests: Recent Labs  Lab 08/19/19 2108 08/22/19 0024  AST 38 73*  ALT 17 27  ALKPHOS 39 34*  BILITOT 0.4 0.7  PROT 7.5 7.6  ALBUMIN 3.5 3.4*   No results for  input(s): LIPASE, AMYLASE in the last 168 hours. No results for input(s): AMMONIA in the last 168 hours. Coagulation Profile: No results for input(s): INR, PROTIME in the last 168 hours. Cardiac Enzymes: No results for input(s): CKTOTAL, CKMB, CKMBINDEX, TROPONINI in the last 168 hours. BNP (last 3 results) No results for input(s): PROBNP in the last 8760 hours. HbA1C: No results for input(s): HGBA1C in the last 72 hours. CBG: Recent Labs  Lab 08/22/19 0021  GLUCAP 107*   Lipid Profile: Recent Labs    08/22/19 0344  TRIG 97   Thyroid Function Tests: No results for input(s): TSH, T4TOTAL, FREET4, T3FREE, THYROIDAB in the last 72 hours. Anemia Panel: Recent Labs    08/22/19 0344  FERRITIN 44   Urine analysis:    Component Value Date/Time   COLORURINE YELLOW 08/19/2019 2108   APPEARANCEUR  CLOUDY (A) 08/19/2019 2108   LABSPEC 1.020 08/19/2019 2108   PHURINE 6.0 08/19/2019 2108   GLUCOSEU NEGATIVE 08/19/2019 2108   HGBUR LARGE (A) 08/19/2019 2108   BILIRUBINUR NEGATIVE 08/19/2019 2108   KETONESUR NEGATIVE 08/19/2019 2108   PROTEINUR NEGATIVE 08/19/2019 2108   UROBILINOGEN 1.0 01/04/2014 1425   NITRITE NEGATIVE 08/19/2019 2108   LEUKOCYTESUR NEGATIVE 08/19/2019 2108   Sepsis Labs: @LABRCNTIP (procalcitonin:4,lacticidven:4) ) Recent Results (from the past 240 hour(s))  SARS Coronavirus 2 Ag (30 min TAT) - Nasal Swab (BD Veritor Kit)     Status: Abnormal   Collection Time: 08/19/19  9:08 PM   Specimen: Nasal Swab (BD Veritor Kit)  Result Value Ref Range Status   SARS Coronavirus 2 Ag POSITIVE (A) NEGATIVE Final    Comment: RESULT CALLED TO, READ BACK BY AND VERIFIED WITH: NEAL,K AT 2130 ON [id number] BY CHERESNOWSKY,T (NOTE) SARS-CoV-2 antigen PRESENT. Positive results indicate the presence of viral antigens, but clinical correlation with patient history and other diagnostic information is necessary to determine patient infection status.  Positive results do not rule  out bacterial infection or co-infection  with other viruses. False positive results are rare but can occur, and confirmatory RT-PCR testing may be appropriate in some circumstances. The expected result is Negative. Fact Sheet for Patients: PodPark.tn Fact Sheet for Providers: GiftContent.is  This test is not yet approved or cleared by the [place] FDA and  has been authorized for detection and/or diagnosis of SARS-CoV-2 by FDA under an Emergency Use Authorization (EUA).  This EUA will remain in effect (meaning this test can be used) for the duration of  the COVID-19  declaration under Section 564(b)(1) of the Act, 21 U.S.C. section 360bbb-3(b)(1), unless the authorization is terminated or revoked sooner. Performed at [facility], [address] [place], [place] [address]      Radiological Exams on Admission: DG Chest Portable 1 View  Result Date: 08/22/2019 CLINICAL DATA:  Fever, COVID EXAM: PORTABLE CHEST 1 VIEW COMPARISON:  08/19/2019 FINDINGS: Moderate bilateral patchy consolidations without significant change. Stable cardiomediastinal silhouette. No pneumothorax. IMPRESSION: Similar bilateral patchy airspace opacities and consolidations consistent with pneumonia Electronically Signed   By: Donavan Foil M.D.   On: 08/22/2019 01:29    EKG: Independently reviewed. Sinus rhythm.   Assessment/Plan   1. COVID-19 pneumonia  - Developed aches, cough, and nausea on 08/14/19, diagnosed with COVID on 4/7, now presents with progressive worsening, has viral PNA type findings on CXR and is requiring supplemental O2   - She was treated with supplemental O2, Decadron, and remdesivir in ED  - Continue Decadron, remdesivir, and supplemental O2, trend markers, continue supportive care    2. Type II DM  - A1c was 6.4% in March 2021  - She will be on systemic steroid as above  - CBG checks, Tradjenta, low-intensity SSI     3. Hypertension  - BP at goal, pharmacy medication-reconciliation pending, will monitor for now    4. Anemia  - Stable, no active bleeding     DVT prophylaxis: Lovenox Code Status: Full  Family Communication: Discussed with patient  Disposition Plan: Likely back home in 2-3 days once respiratory status improved enough to allow ADLs without significant dyspnea  Consults called: none  Admission status: Inpatient     Vianne Bulls, MD [facility] Pager: See [email or web address]  If 7AM-7PM, please contact the daytime attending [email or web address]  08/22/2019, 7:38 PM

## 2019-08-22 NOTE — ED Triage Notes (Signed)
Covid positive 3 days ago.

## 2019-08-22 NOTE — ED Notes (Signed)
Pt alert, able to speak full sentences.  Cough with mild sputum. C/O body aches. No acute resp distress.

## 2019-08-22 NOTE — ED Provider Notes (Signed)
[facility] EMERGENCY DEPARTMENT Provider Note   CSN: [id number] Arrival date & time: 08/21/19  2354     History Chief Complaint  Patient presents with  . Emesis    Colleen Schultz is a 34 y.o. female.  The history is provided by the patient.  Cough Severity:  Moderate Onset quality:  Gradual Duration:  3 days Timing:  Intermittent Progression:  Worsening Chronicity:  New Smoker: no   Relieved by:  Nothing Worsened by:  Nothing Associated symptoms: chest pain, chills, fever, myalgias and shortness of breath   Risk factors: recent infection   Patient with a history of obesity, hypertension, diabetes presents with cough, chest pain or shortness of breath. Patient was diagnosed with COVID-19 on April 7.  Since that time she has had increasing cough, shortness of breath and chest pain.  She also reports associated nausea vomiting.  She reports dyspnea on exertion.    Past Medical History:  Diagnosis Date  . Anemia   . Blood transfusion without reported diagnosis   . Diabetes mellitus without complication (HCC)   . Hypertension   . Obesity   . Thyroid disease     Patient Active Problem List   Diagnosis Date Noted  . Acute respiratory disease due to COVID-19 virus 08/22/2019  . Acute pain of right shoulder 02/05/2019    Past Surgical History:  Procedure Laterality Date  . CARPAL TUNNEL RELEASE    . CESAREAN SECTION    . LEEP    . THYROID SURGERY       OB History   No obstetric history on file.     No family history on file.  Social History   Tobacco Use  . Smoking status: Never Smoker  . Smokeless tobacco: Never Used  Substance Use Topics  . Alcohol use: Not Currently  . Drug use: No    Home Medications Prior to Admission medications   Medication Sig Start Date End Date Taking? Authorizing Provider  letrozole [facility]) 2.5 MG tablet Take 1 tablet on days 5,6,7,8,9 of cycle 07/24/19  Yes [provider]  medroxyPROGESTERone  (PROVERA) 10 MG tablet Take 2 tabs BY MOUTH EVERY MORNING for 5 days to start period 07/24/19  Yes [provider]  chlorhexidine (HIBICLENS) 4 % external liquid Apply topically daily as needed. 02/09/19   Horton, Mayer Masker, MD  doxycycline (VIBRAMYCIN) 100 MG capsule Take 1 capsule (100 mg total) by mouth 2 (two) times daily. 02/09/19   Horton, Mayer Masker, MD  Iron-FA-B Cmp-C-Biot-Probiotic (FUSION PLUS PO) Take 1 capsule by mouth 3 (three) times daily.    [provider]  labetalol (NORMODYNE) 100 MG tablet Take 100 mg by mouth 2 (two) times daily. 07/24/19   [provider]  lisinopril (PRINIVIL,ZESTRIL) 2.5 MG tablet Take 2.5 mg by mouth daily.    [provider]  meloxicam (MOBIC) 15 MG tablet Take 1 tablet (15 mg total) by mouth daily. 01/07/19   [provider]  metFORMIN (GLUCOPHAGE) 500 MG tablet Take by mouth daily with breakfast.    [provider]  predniSONE (DELTASONE) 5 MG tablet Take 6 pills for first day, 5 pills second day, 4 pills third day, 3 pills fourth day, 2 pills the fifth day, and 1 pill sixth day. 02/05/19   Myra Rude, MD  pregabalin (LYRICA) 200 MG capsule Take by mouth.    [provider]  Prenatal Vit-Iron Carbonyl-FA (PRENATAL PLUS IRON) 29-1 MG TABS Take 1 tablet by mouth daily.  01/20/19   [provider]  promethazine (PHENERGAN) 12.5 MG tablet Take 1 tablet (12.5 mg total) by mouth every 6 (six) hours as needed for up to 30 days for Nausea 12/22/18   [provider]    Allergies    Adhesive [tape] and Cinnamon  Review of Systems   Review of Systems  Constitutional: Positive for chills, fatigue and fever.  Respiratory: Positive for cough and shortness of breath.   Cardiovascular: Positive for chest pain.  Gastrointestinal: Positive for nausea and vomiting.  Musculoskeletal: Positive for myalgias.  All other systems reviewed and are negative.   Physical Exam Updated Vital  Signs BP 129/77   Pulse (!) 110   Temp 100.1 F (37.8 C)   Resp (!) 22   Ht 1.575 m (5\' 2" )   Wt 125 kg   LMP 08/19/2019   SpO2 95%   BMI 50.40 kg/m  Pulse ox at 87% on room air Physical Exam  CONSTITUTIONAL: Well developed/well nourished HEAD: Normocephalic/atraumatic EYES: EOMI/PERRL NECK: supple no meningeal signs SPINE/BACK:entire spine nontender CV: S1/S2 noted, no murmurs/rubs/gallops noted LUNGS: Tachypnea, decreased breath sounds bilaterally ABDOMEN: soft, nontender, no rebound or guarding, bowel sounds noted throughout abdomen, obese GU:no cva tenderness NEURO: Pt is awake/alert/appropriate, moves all extremitiesx4.  No facial droop.  EXTREMITIES: pulses normal/equal, full ROM SKIN: warm, color normal PSYCH: no abnormalities of mood noted, alert and oriented to situation  ED Results / Procedures / Treatments   Labs (all labs ordered are listed, but only abnormal results are displayed) Labs Reviewed  CBC WITH DIFFERENTIAL/PLATELET - Abnormal; Notable for the following components:      Result Value   Hemoglobin 10.5 (*)    HCT 35.3 (*)    MCV 77.8 (*)    MCH 23.1 (*)    MCHC 29.7 (*)    RDW 16.6 (*)    All other components within normal limits  COMPREHENSIVE METABOLIC PANEL - Abnormal; Notable for the following components:   Glucose, Bld 106 (*)    BUN 5 (*)    Calcium 8.0 (*)    Albumin 3.4 (*)    AST 73 (*)    Alkaline Phosphatase 34 (*)    All other components within normal limits  CBG MONITORING, ED - Abnormal; Notable for the following components:   Glucose-Capillary 107 (*)    All other components within normal limits  LACTIC ACID, PLASMA  LACTIC ACID, PLASMA  D-DIMER, QUANTITATIVE (NOT AT [facility])  PROCALCITONIN  LACTATE DEHYDROGENASE  FERRITIN  TRIGLYCERIDES  FIBRINOGEN  C-REACTIVE PROTEIN    EKG EKG Interpretation  Date/Time:  Saturday August 22 2019 03:37:11 EDT Ventricular Rate:  89 PR Interval:    QRS Duration: 89 QT  Interval:  366 QTC Calculation: 446 R Axis:   43 Text Interpretation: Sinus rhythm No significant change since last tracing Confirmed by Ripley Fraise [phone]) on 08/22/2019 3:45:10 AM   Radiology DG Chest Portable 1 View  Result Date: 08/22/2019 CLINICAL DATA:  Fever, COVID EXAM: PORTABLE CHEST 1 VIEW COMPARISON:  08/19/2019 FINDINGS: Moderate bilateral patchy consolidations without significant change. Stable cardiomediastinal silhouette. No pneumothorax. IMPRESSION: Similar bilateral patchy airspace opacities and consolidations consistent with pneumonia Electronically Signed   By: Donavan Foil M.D.   On: 08/22/2019 01:29    Procedures .Critical Care Performed by: Ripley Fraise, MD Authorized by: Ripley Fraise, MD   Critical care provider statement:    Critical care time (minutes):  50   Critical care start time:  08/22/2019 3:00 AM  Critical care end time:  08/22/2019 3:50 AM   Critical care time was exclusive of:  Separately billable procedures and treating other patients   Critical care was necessary to treat or prevent imminent or life-threatening deterioration of the following conditions:  Respiratory failure and sepsis   Critical care was time spent personally by me on the following activities:  Ordering and performing treatments and interventions, ordering and review of laboratory studies, ordering and review of radiographic studies, pulse oximetry, re-evaluation of patient's condition, discussions with consultants, development of treatment plan with patient or surrogate, evaluation of patient's response to treatment, examination of patient and review of old charts   I assumed direction of critical care for this patient from another provider in my specialty: no       Medications Ordered in ED Medications  remdesivir 100 mg in sodium chloride 0.9 % 100 mL IVPB (has no administration in time range)    Followed by  remdesivir 100 mg in sodium chloride 0.9 % 100 mL IVPB  (has no administration in time range)  sodium chloride 0.9 % bolus 1,000 mL ( Intravenous Stopped 08/22/19 0121)  ondansetron (ZOFRAN) injection 4 mg (4 mg Intravenous Given 08/22/19 0042)  guaiFENesin (ROBITUSSIN) 100 MG/5ML solution 100 mg (100 mg Oral Given 08/22/19 0333)  dexamethasone (DECADRON) injection 6 mg (6 mg Intravenous Given 08/22/19 0350)  acetaminophen (TYLENOL) tablet 650 mg (650 mg Oral Given 08/22/19 0331)  fentaNYL (SUBLIMAZE) injection 100 mcg (100 mcg Intravenous Given 08/22/19 0354)    ED Course  I have reviewed the triage vital signs and the nursing notes.  Pertinent labs & imaging results that were available during my care of the patient were reviewed by me and considered in my medical decision making (see chart for details).    MDM Rules/Calculators/A&P                      3:57 AM Patient with recent diagnosis of COVID-19 now with worsening symptoms.  Patient reports increasing chest pain, cough and shortness of breath.  She reports dyspnea on exertion. On my examination patient appears tachypneic.  When I removed oxygen, she quickly dropped to 87% oxygen saturation on room air.  With any movement in the bed she becomes short of breath and hypoxic. Patient has multiple comorbidities which can contribute to worsening of COVID-19.  She has worsening infiltrates on x-ray that revealed Covid pneumonia. Patient will require admission.  IV Decadron has been ordered. Discussed with Dr. Antionette Char for admission.   On reassessment patient's work of breathing does appear to be improved.  She reports continued chest wall soreness with coughing.  IV analgesics have been ordered. Patient will continue to be monitored.  Current pulse ox is 95% on nasal cannula.  Other vitals are currently appropriate this time.    MDM Number of Diagnoses or Management Options Acute respiratory failure with hypoxia (HCC): new, needed workup Pneumonia due to COVID-19 virus: established, worsening    Amount and/or Complexity of Data Reviewed Clinical lab tests: ordered and reviewed Tests in the radiology section of CPT: ordered and reviewed Tests in the medicine section of CPT: ordered and reviewed Decide to obtain previous medical records or to obtain history from someone other than the patient: yes Review and summarize past medical records: yes Discuss the patient with other providers: yes Independent visualization of images, tracings, or specimens: yes  Risk of Complications, Morbidity, and/or Mortality Presenting problems: high Diagnostic procedures: high Management options: high  Critical Care Total time providing critical care: 30-74 minutes  Patient Progress Patient progress: stable   INES REBEL was evaluated in Emergency Department on 08/22/2019 for the symptoms described in the history of present illness. She was evaluated in the context of the global COVID-19 pandemic, which necessitated consideration that the patient might be at risk for infection with the SARS-CoV-2 virus that causes COVID-19. Institutional protocols and algorithms that pertain to the evaluation of patients at risk for COVID-19 are in a state of rapid change based on information released by regulatory bodies including the CDC and federal and state organizations. These policies and algorithms were followed during the patient's care in the ED.   Final Clinical Impression(s) / ED Diagnoses Final diagnoses:  Pneumonia due to COVID-19 virus  Acute respiratory failure with hypoxia [facility])    Rx / DC Orders ED Discharge Orders    None       Zadie Rhine, MD 08/22/19 0400

## 2019-08-22 NOTE — ED Triage Notes (Signed)
Patient presents via EMS from home with complaints of weakness, vomiting, dizziness and fever; states took tylenol 2 hours pta.

## 2019-08-22 NOTE — ED Notes (Signed)
Placed patient on humidified 2L [place]. O2 oxygen saturations are 97%.

## 2019-08-22 NOTE — ED Notes (Signed)
Consult for hospitalist called to [facility]

## 2019-08-23 LAB — CBC WITH DIFFERENTIAL/PLATELET
Abs Immature Granulocytes: 0.04 10*3/uL (ref 0.00–0.07)
Basophils Absolute: 0 10*3/uL (ref 0.0–0.1)
Basophils Relative: 0 %
Eosinophils Absolute: 0 10*3/uL (ref 0.0–0.5)
Eosinophils Relative: 0 %
HCT: 34.3 % — ABNORMAL LOW (ref 36.0–46.0)
Hemoglobin: 10.1 g/dL — ABNORMAL LOW (ref 12.0–15.0)
Immature Granulocytes: 1 %
Lymphocytes Relative: 16 %
Lymphs Abs: 0.8 10*3/uL (ref 0.7–4.0)
MCH: 22.9 pg — ABNORMAL LOW (ref 26.0–34.0)
MCHC: 29.4 g/dL — ABNORMAL LOW (ref 30.0–36.0)
MCV: 77.6 fL — ABNORMAL LOW (ref 80.0–100.0)
Monocytes Absolute: 0.2 10*3/uL (ref 0.1–1.0)
Monocytes Relative: 4 %
Neutro Abs: 3.8 10*3/uL (ref 1.7–7.7)
Neutrophils Relative %: 79 %
Platelets: 274 10*3/uL (ref 150–400)
RBC: 4.42 MIL/uL (ref 3.87–5.11)
RDW: 16.4 % — ABNORMAL HIGH (ref 11.5–15.5)
WBC: 4.8 10*3/uL (ref 4.0–10.5)
nRBC: 0 % (ref 0.0–0.2)

## 2019-08-23 LAB — ABO/RH: ABO/RH(D): O POS

## 2019-08-23 LAB — GLUCOSE, CAPILLARY
Glucose-Capillary: 138 mg/dL — ABNORMAL HIGH (ref 70–99)
Glucose-Capillary: 125 mg/dL — ABNORMAL HIGH (ref 70–99)
Glucose-Capillary: 136 mg/dL — ABNORMAL HIGH (ref 70–99)
Glucose-Capillary: 141 mg/dL — ABNORMAL HIGH (ref 70–99)

## 2019-08-23 LAB — COMPREHENSIVE METABOLIC PANEL
ALT: 28 U/L (ref 0–44)
AST: 57 U/L — ABNORMAL HIGH (ref 15–41)
Albumin: 3.1 g/dL — ABNORMAL LOW (ref 3.5–5.0)
Alkaline Phosphatase: 30 U/L — ABNORMAL LOW (ref 38–126)
Anion gap: 8 (ref 5–15)
BUN: 9 mg/dL (ref 6–20)
CO2: 26 mmol/L (ref 22–32)
Calcium: 8.5 mg/dL — ABNORMAL LOW (ref 8.9–10.3)
Chloride: 106 mmol/L (ref 98–111)
Creatinine, Ser: 0.71 mg/dL (ref 0.44–1.00)
GFR calc Af Amer: >60 mL/min (ref >60)
GFR calc non Af Amer: >60 mL/min (ref >60)
Glucose, Bld: 127 mg/dL — ABNORMAL HIGH (ref 70–99)
Potassium: 4.3 mmol/L (ref 3.5–5.1)
Sodium: 140 mmol/L (ref 135–145)
Total Bilirubin: 0.4 mg/dL (ref 0.3–1.2)
Total Protein: 7.3 g/dL (ref 6.5–8.1)

## 2019-08-23 LAB — C-REACTIVE PROTEIN: CRP: 10.7 mg/dL — ABNORMAL HIGH (ref <1.0)

## 2019-08-23 LAB — FERRITIN: Ferritin: 76 ng/mL (ref 11–307)

## 2019-08-23 LAB — D-DIMER, QUANTITATIVE: D-Dimer, Quant: 1.03 ug/mL-FEU — ABNORMAL HIGH (ref 0.00–0.50)

## 2019-08-23 LAB — MAGNESIUM: Magnesium: 2.4 mg/dL (ref 1.7–2.4)

## 2019-08-23 LAB — HIV ANTIBODY (ROUTINE TESTING W REFLEX): HIV Screen 4th Generation wRfx: NONREACTIVE

## 2019-08-23 MED ORDER — HYDROCOD POLST-CPM POLST ER 10-8 MG/5ML PO SUER
5.0000 mL | Freq: Once | ORAL | Status: AC
  Administered 2019-08-23: 5 mL via ORAL

## 2019-08-23 MED ORDER — ENOXAPARIN SODIUM 60 MG/0.6ML ~~LOC~~ SOLN
60.0000 mg | SUBCUTANEOUS | Status: DC
  Administered 2019-08-23 – 2019-08-27 (×5): 60 mg via SUBCUTANEOUS
  Filled 2019-08-23 (×5): qty 0.6

## 2019-08-23 MED ORDER — HYDROCOD POLST-CPM POLST ER 10-8 MG/5ML PO SUER
5.0000 mL | Freq: Once | ORAL | Status: DC
  Filled 2019-08-23: qty 5

## 2019-08-23 NOTE — Progress Notes (Signed)
PROGRESS NOTE    Colleen Schultz  [id number] DOB: November 07, 1985 DOA: 08/22/2019 PCP: [facility]   Brief Narrative: 34 y.o. female with medical history significant for hypertension, type 2 diabetes mellitus, BMI 50, and hidradenitis, now presenting to the emergency department with aches, cough, fevers, chills, nausea, vomiting, and progressive shortness of breath.  Symptoms began around 08/14/2019, she was seen in the emergency department on 08/19/2019 and diagnosed with COVID-19 at that time with viral pneumonia type findings on chest x-ray, but was stable for discharge home then.  She has since experienced progressive worsening and returns to the ED. She has also developed some nausea and loose stools now without abdominal pain. She continues to have general aches and subjective fevers. Her cough persists and is non-productive. She has been using Tylenol at home.   [facility] ED Course: Upon arrival to the ED, patient is found to be afebrile, requiring 3 L/min of supplemental oxygen, tachypneic, and with stable blood pressure.  EKG features sinus rhythm and chest x-ray with patchy bilateral airspace opacities and consolidations.  Chemistry panel with mild elevation in AST.  CBC with stable chronic microcytic anemia.  D-dimer elevated to 1.27 and CRP elevated to 10.5.  Procalcitonin is undetectable.  Patient was started on supplemental oxygen and treated with a liter of normal saline, Decadron, remdesivir, fentanyl, acetaminophen, cough syrup, and Zofran.  Assessment & Plan:   Principal Problem:   Acute respiratory disease due to COVID-19 virus Active Problems:   Diabetes mellitus without complication ([place])   Hypertension   Anemia   Pneumonia due to COVID-19 virus   #1 acute hypoxic respiratory failure secondary to COVID-19 pneumonia-patient is on 2 L of oxygen saturating above 92%. Continue remdesivir and Decadron. Her CRP is 10.7. Chest x-ray shows infiltrates  consistent with Covid pneumonia. If she gets more hypoxic with CRP trending up consider Actemra. She is certainly at high risk for decompensation with comorbidities.  #2 type 2 diabetes continue SSI.  On Tradjenta. CBG (last 3)  Recent Labs    08/22/19 2021 08/23/19 0902 08/23/19 1320  GLUCAP 132* 138* 125*     #3 history of essential hypertension 125/73.  #4 morbid obesity BMI 50.48.    Estimated body mass index is 50.48 kg/m as calculated from the following:   Height as of this encounter: 5' 2"  (1.575 m).   Weight as of this encounter: 125.2 kg.  DVT prophylaxis: Lovenox  code Status full code Family Communication: None Disposition Plan: Patient came from home Plan discharge home Barriers to discharge acute hypoxia secondary to Covid pneumonia Consultants:   None  Procedures: None Antimicrobials: None Subjective: Patient sleeping prone in no acute distress  Objective: Vitals:   08/23/19 0030 08/23/19 0100 08/23/19 0200 08/23/19 0608  BP:    125/73  Pulse:    (!) 59  Resp:    15  Temp:    (!) 97.5 F (36.4 C)  TempSrc:    Oral  SpO2: 98% 97% 97% 94%  Weight:      Height:       No intake or output data in the 24 hours ending 08/23/19 1330 Filed Weights   08/22/19 0014 08/22/19 2035  Weight: 125 kg 125.2 kg    Examination:  General exam: Appears calm and comfortable  Respiratory system: Clear to auscultation. Respiratory effort normal. Cardiovascular system: S1 & S2 heard, RRR. No JVD, murmurs, rubs, gallops or clicks. No pedal edema. Gastrointestinal system: Abdomen is nondistended, soft  and nontender. No organomegaly or masses felt. Normal bowel sounds heard. Central nervous system: Alert and oriented. No focal neurological deficits. Extremities: Symmetric 5 x 5 power. Skin: No rashes, lesions or ulcers Psychiatry: Judgement and insight appear normal. Mood & affect appropriate.     Data Reviewed: I have personally reviewed following labs and  imaging studies  CBC: Recent Labs  Lab 08/19/19 2108 08/22/19 0024 08/23/19 0411  WBC 4.5 5.9 4.8  NEUTROABS 3.0 4.5 3.8  HGB 10.8* 10.5* 10.1*  HCT 35.4* 35.3* 34.3*  MCV 76.6* 77.8* 77.6*  PLT 242 222 213   Basic Metabolic Panel: Recent Labs  Lab 08/19/19 2108 08/22/19 0024 08/23/19 0411  NA 134* 141 140  K 3.3* 3.8 4.3  CL 103 108 106  CO2 24 24 26   GLUCOSE 109* 106* 127*  BUN 6 5* 9  CREATININE 1.13* 0.94 0.71  CALCIUM 8.3* 8.0* 8.5*  MG  --   --  2.4   GFR: Estimated Creatinine Clearance: 127.7 mL/min (by C-G formula based on SCr of 0.71 mg/dL). Liver Function Tests: Recent Labs  Lab 08/19/19 2108 08/22/19 0024 08/23/19 0411  AST 38 73* 57*  ALT 17 27 28   ALKPHOS 39 34* 30*  BILITOT 0.4 0.7 0.4  PROT 7.5 7.6 7.3  ALBUMIN 3.5 3.4* 3.1*   No results for input(s): LIPASE, AMYLASE in the last 168 hours. No results for input(s): AMMONIA in the last 168 hours. Coagulation Profile: No results for input(s): INR, PROTIME in the last 168 hours. Cardiac Enzymes: No results for input(s): CKTOTAL, CKMB, CKMBINDEX, TROPONINI in the last 168 hours. BNP (last 3 results) No results for input(s): PROBNP in the last 8760 hours. HbA1C: No results for input(s): HGBA1C in the last 72 hours. CBG: Recent Labs  Lab 08/22/19 0021 08/22/19 2021 08/23/19 0902 08/23/19 1320  GLUCAP 107* 132* 138* 125*   Lipid Profile: Recent Labs    08/22/19 0344  TRIG 97   Thyroid Function Tests: No results for input(s): TSH, T4TOTAL, FREET4, T3FREE, THYROIDAB in the last 72 hours. Anemia Panel: Recent Labs    08/22/19 0344 08/23/19 0411  FERRITIN 44 76   Sepsis Labs: Recent Labs  Lab 08/22/19 0015 08/22/19 0235 08/22/19 0344  PROCALCITON  --   --  <0.10  LATICACIDVEN 1.3 1.3  --     Recent Results (from the past 240 hour(s))  SARS Coronavirus 2 Ag (30 min TAT) - Nasal Swab (BD Veritor Kit)     Status: Abnormal   Collection Time: 08/19/19  9:08 PM   Specimen:  Nasal Swab (BD Veritor Kit)  Result Value Ref Range Status   SARS Coronavirus 2 Ag POSITIVE (A) NEGATIVE Final    Comment: RESULT CALLED TO, READ BACK BY AND VERIFIED WITH: NEAL,K AT 2130 ON [id number] BY CHERESNOWSKY,T (NOTE) SARS-CoV-2 antigen PRESENT. Positive results indicate the presence of viral antigens, but clinical correlation with patient history and other diagnostic information is necessary to determine patient infection status.  Positive results do not rule out bacterial infection or co-infection  with other viruses. False positive results are rare but can occur, and confirmatory RT-PCR testing may be appropriate in some circumstances. The expected result is Negative. Fact Sheet for Patients: PodPark.tn Fact Sheet for Providers: GiftContent.is  This test is not yet approved or cleared by the [place] FDA and  has been authorized for detection and/or diagnosis of SARS-CoV-2 by FDA under an Emergency Use Authorization (EUA).  This EUA will remain in effect (meaning this  test can be used) for the duration of  the COVID-19  declaration under Section 564(b)(1) of the Act, 21 U.S.C. section 360bbb-3(b)(1), unless the authorization is terminated or revoked sooner. Performed at [facility], [address] [place], [place] [address]          Radiology Studies: DG Chest Portable 1 View  Result Date: 08/22/2019 CLINICAL DATA:  Fever, COVID EXAM: PORTABLE CHEST 1 VIEW COMPARISON:  08/19/2019 FINDINGS: Moderate bilateral patchy consolidations without significant change. Stable cardiomediastinal silhouette. No pneumothorax. IMPRESSION: Similar bilateral patchy airspace opacities and consolidations consistent with pneumonia Electronically Signed   By: Donavan Foil M.D.   On: 08/22/2019 01:29        Scheduled Meds: . chlorpheniramine-HYDROcodone  5 mL Oral Once  . dexamethasone (DECADRON) injection  6 mg  Intravenous Q24H  . enoxaparin (LOVENOX) injection  60 mg Subcutaneous Q24H  . insulin aspart  0-9 Units Subcutaneous TID WC  . linagliptin  5 mg Oral Daily  . sodium chloride flush  3 mL Intravenous Q12H   Continuous Infusions: . remdesivir 100 mg in NS 100 mL 100 mg (08/23/19 1122)     LOS: 1 day     Georgette Shell, MD  08/23/2019, 1:30 PM

## 2019-08-24 LAB — COMPREHENSIVE METABOLIC PANEL
ALT: 29 U/L (ref 0–44)
AST: 42 U/L — ABNORMAL HIGH (ref 15–41)
Albumin: 3 g/dL — ABNORMAL LOW (ref 3.5–5.0)
Alkaline Phosphatase: 32 U/L — ABNORMAL LOW (ref 38–126)
Anion gap: 10 (ref 5–15)
BUN: 17 mg/dL (ref 6–20)
CO2: 25 mmol/L (ref 22–32)
Calcium: 8.7 mg/dL — ABNORMAL LOW (ref 8.9–10.3)
Chloride: 103 mmol/L (ref 98–111)
Creatinine, Ser: 0.83 mg/dL (ref 0.44–1.00)
GFR calc Af Amer: >60 mL/min (ref >60)
GFR calc non Af Amer: >60 mL/min (ref >60)
Glucose, Bld: 153 mg/dL — ABNORMAL HIGH (ref 70–99)
Potassium: 4.5 mmol/L (ref 3.5–5.1)
Sodium: 138 mmol/L (ref 135–145)
Total Bilirubin: 0.4 mg/dL (ref 0.3–1.2)
Total Protein: 7.2 g/dL (ref 6.5–8.1)

## 2019-08-24 LAB — CBC WITH DIFFERENTIAL/PLATELET
Abs Immature Granulocytes: 0.11 10*3/uL — ABNORMAL HIGH (ref 0.00–0.07)
Basophils Absolute: 0 10*3/uL (ref 0.0–0.1)
Basophils Relative: 0 %
Eosinophils Absolute: 0 10*3/uL (ref 0.0–0.5)
Eosinophils Relative: 0 %
HCT: 35.9 % — ABNORMAL LOW (ref 36.0–46.0)
Hemoglobin: 10.5 g/dL — ABNORMAL LOW (ref 12.0–15.0)
Immature Granulocytes: 1 %
Lymphocytes Relative: 13 %
Lymphs Abs: 1.2 10*3/uL (ref 0.7–4.0)
MCH: 23 pg — ABNORMAL LOW (ref 26.0–34.0)
MCHC: 29.2 g/dL — ABNORMAL LOW (ref 30.0–36.0)
MCV: 78.6 fL — ABNORMAL LOW (ref 80.0–100.0)
Monocytes Absolute: 0.4 10*3/uL (ref 0.1–1.0)
Monocytes Relative: 4 %
Neutro Abs: 7.7 10*3/uL (ref 1.7–7.7)
Neutrophils Relative %: 82 %
Platelets: 332 10*3/uL (ref 150–400)
RBC: 4.57 MIL/uL (ref 3.87–5.11)
RDW: 16.7 % — ABNORMAL HIGH (ref 11.5–15.5)
WBC: 9.5 10*3/uL (ref 4.0–10.5)
nRBC: 0 % (ref 0.0–0.2)

## 2019-08-24 LAB — FERRITIN: Ferritin: 68 ng/mL (ref 11–307)

## 2019-08-24 LAB — GLUCOSE, CAPILLARY
Glucose-Capillary: 129 mg/dL — ABNORMAL HIGH (ref 70–99)
Glucose-Capillary: 165 mg/dL — ABNORMAL HIGH (ref 70–99)
Glucose-Capillary: 142 mg/dL — ABNORMAL HIGH (ref 70–99)

## 2019-08-24 LAB — D-DIMER, QUANTITATIVE: D-Dimer, Quant: 1.05 ug/mL-FEU — ABNORMAL HIGH (ref 0.00–0.50)

## 2019-08-24 LAB — C-REACTIVE PROTEIN: CRP: 4.5 mg/dL — ABNORMAL HIGH (ref <1.0)

## 2019-08-24 MED ORDER — HYDROCOD POLST-CPM POLST ER 10-8 MG/5ML PO SUER
5.0000 mL | Freq: Two times a day (BID) | ORAL | Status: DC | PRN
  Administered 2019-08-24 – 2019-08-29 (×6): 5 mL via ORAL
  Filled 2019-08-24 (×6): qty 5

## 2019-08-24 MED ORDER — LETROZOLE 2.5 MG PO TABS
2.5000 mg | ORAL_TABLET | Freq: Every day | ORAL | Status: AC
  Administered 2019-08-27 – 2019-08-31 (×5): 2.5 mg via ORAL
  Filled 2019-08-24 (×5): qty 1

## 2019-08-24 MED ORDER — MEDROXYPROGESTERONE ACETATE 10 MG PO TABS: 20.0000 mg | ORAL_TABLET | ORAL | Status: DC

## 2019-08-24 NOTE — Progress Notes (Signed)
Attempted to wean Oxygen down from 2 liters. Patient was sitting on the side of the bed and reported worsening shorness of breath. O2 reaplied at 2 liters without resolve. O2 increased to 4 lts sat increased 91-92%.

## 2019-08-24 NOTE — Progress Notes (Signed)
PROGRESS NOTE    Colleen Schultz  XBM:[id number] DOB: March 02, 1986 DOA: 08/22/2019 PCP: [facility]   Brief Narrative:  34 y.o.femalewith medical history significant forhypertension, type 2 diabetes mellitus, BMI 50, and hidradenitis, now presenting to the emergency department with aches, cough, fevers, chills, nausea, vomiting, and progressive shortness of breath. Symptoms began around 08/14/2019, she was seen in the emergency department on 08/19/2019 and diagnosed with COVID-19 at that time with viral pneumonia type findings on chest x-ray, but was stable for discharge home then. She has since experienced progressive worsening and returns to the ED. She has also developed some nausea and loose stools now without abdominal pain. She continues to have general aches and subjective fevers. Her cough persists and is non-productive. She has been using Tylenol at home.  [facility] arrival to the ED, patient is found to be afebrile, requiring 3 L/min of supplemental oxygen, tachypneic, and with stable blood pressure. EKG features sinus rhythm and chest x-ray with patchy bilateral airspace opacities and consolidations. Chemistry panel with mild elevation in AST. CBC with stable chronic microcytic anemia. D-dimer elevated to 1.27 and CRP elevated to 10.5. Procalcitonin is undetectable. Patient was started on supplemental oxygen and treated with a liter of normal saline, Decadron, remdesivir, fentanyl, acetaminophen, cough syrup, and Zofran.  Assessment & Plan:   Principal Problem:   Acute respiratory disease due to COVID-19 virus Active Problems:   Diabetes mellitus without complication ([place])   Hypertension   Anemia   Pneumonia due to COVID-19 virus   #1 acute hypoxic respiratory failure secondary to COVID-19 pneumonia-patient is on 4 L of oxygen .sats dropped with titrating down.. Continue remdesivir and Decadron. Her CRP is 4.5 down from 10.7. Chest  x-ray shows infiltrates consistent with Covid pneumonia. If she gets more hypoxic with CRP trending up consider Actemra. She is certainly at high risk for decompensation with comorbidities.  #2 type 2 diabetes continue SSI.  On Tradjenta. Metformin on hold CBG (last 3)  Recent Labs    08/23/19 2224 08/24/19 0757 08/24/19 1226  GLUCAP 141* 129* 165*     #3 history of essential hypertension 125/73.antihypertensives on hold.  #4 morbid obesity BMI 50.48.   Estimated body mass index is 50.48 kg/m as calculated from the following:   Height as of this encounter: 5' 2"  (1.575 m).   Weight as of this encounter: 125.2 kg.  DVT prophylaxis: Lovenox  code Status full code Family Communication: None Disposition Plan: Patient came from home Plan discharge home Barriers to discharge acute hypoxia secondary to Covid pneumonia Consultants:   None  Procedures: None Antimicrobials: None  Subjective:  Sleeping on her back in nad on 4lit oxygen   Objective: Vitals:   08/23/19 2227 08/24/19 0535 08/24/19 0829 08/24/19 1043  BP: (!) 139/59 (!) 138/54 123/70 114/60  Pulse: (!) 55 (!) 53 (!) 51 (!) 51  Resp:   18 16  Temp: 97.6 F (36.4 C) 97.7 F (36.5 C) 97.8 F (36.6 C)   TempSrc: Oral Oral Oral   SpO2: 96% 96% 95% 97%  Weight:      Height:        Intake/Output Summary (Last 24 hours) at 08/24/2019 1218 Last data filed at 08/23/2019 1700 Gross per 24 hour  Intake 200 ml  Output --  Net 200 ml   Filed Weights   08/22/19 0014 08/22/19 2035  Weight: 125 kg 125.2 kg    Examination:  General exam: Appears calm and comfortable  Respiratory system: scattered rhonchi to auscultation. Respiratory effort normal. Cardiovascular system: S1 & S2 heard, RRR. No JVD, murmurs, rubs, gallops or clicks. No pedal edema. Gastrointestinal system: Abdomen is nondistended, soft and nontender. No organomegaly or masses felt. Normal bowel sounds heard. Central nervous system: Alert  and oriented. No focal neurological deficits. Extremities: Symmetric 5 x 5 power. Skin: No rashes, lesions or ulcers Psychiatry: Judgement and insight appear normal. Mood & affect appropriate.     Data Reviewed: I have personally reviewed following labs and imaging studies  CBC: Recent Labs  Lab 08/19/19 2108 08/22/19 0024 08/23/19 0411 08/24/19 0354  WBC 4.5 5.9 4.8 9.5  NEUTROABS 3.0 4.5 3.8 7.7  HGB 10.8* 10.5* 10.1* 10.5*  HCT 35.4* 35.3* 34.3* 35.9*  MCV 76.6* 77.8* 77.6* 78.6*  PLT 242 222 274 384   Basic Metabolic Panel: Recent Labs  Lab 08/19/19 2108 08/22/19 0024 08/23/19 0411 08/24/19 0354  NA 134* 141 140 138  K 3.3* 3.8 4.3 4.5  CL 103 108 106 103  CO2 24 24 26 25   GLUCOSE 109* 106* 127* 153*  BUN 6 5* 9 17  CREATININE 1.13* 0.94 0.71 0.83  CALCIUM 8.3* 8.0* 8.5* 8.7*  MG  --   --  2.4  --    GFR: Estimated Creatinine Clearance: 123 mL/min (by C-G formula based on SCr of 0.83 mg/dL). Liver Function Tests: Recent Labs  Lab 08/19/19 2108 08/22/19 0024 08/23/19 0411 08/24/19 0354  AST 38 73* 57* 42*  ALT 17 27 28 29   ALKPHOS 39 34* 30* 32*  BILITOT 0.4 0.7 0.4 0.4  PROT 7.5 7.6 7.3 7.2  ALBUMIN 3.5 3.4* 3.1* 3.0*   No results for input(s): LIPASE, AMYLASE in the last 168 hours. No results for input(s): AMMONIA in the last 168 hours. Coagulation Profile: No results for input(s): INR, PROTIME in the last 168 hours. Cardiac Enzymes: No results for input(s): CKTOTAL, CKMB, CKMBINDEX, TROPONINI in the last 168 hours. BNP (last 3 results) No results for input(s): PROBNP in the last 8760 hours. HbA1C: No results for input(s): HGBA1C in the last 72 hours. CBG: Recent Labs  Lab 08/23/19 0902 08/23/19 1320 08/23/19 1652 08/23/19 2224 08/24/19 0757  GLUCAP 138* 125* 136* 141* 129*   Lipid Profile: Recent Labs    08/22/19 0344  TRIG 97   Thyroid Function Tests: No results for input(s): TSH, T4TOTAL, FREET4, T3FREE, THYROIDAB in the last  72 hours. Anemia Panel: Recent Labs    08/23/19 0411 08/24/19 0354  FERRITIN 76 68   Sepsis Labs: Recent Labs  Lab 08/22/19 0015 08/22/19 0235 08/22/19 0344  PROCALCITON  --   --  <0.10  LATICACIDVEN 1.3 1.3  --     Recent Results (from the past 240 hour(s))  SARS Coronavirus 2 Ag (30 min TAT) - Nasal Swab (BD Veritor Kit)     Status: Abnormal   Collection Time: 08/19/19  9:08 PM   Specimen: Nasal Swab (BD Veritor Kit)  Result Value Ref Range Status   SARS Coronavirus 2 Ag POSITIVE (A) NEGATIVE Final    Comment: RESULT CALLED TO, READ BACK BY AND VERIFIED WITH: NEAL,K AT 2130 ON [id number] BY CHERESNOWSKY,T (NOTE) SARS-CoV-2 antigen PRESENT. Positive results indicate the presence of viral antigens, but clinical correlation with patient history and other diagnostic information is necessary to determine patient infection status.  Positive results do not rule out bacterial infection or co-infection  with other viruses. False positive results are rare but can occur, and confirmatory RT-PCR  testing may be appropriate in some circumstances. The expected result is Negative. Fact Sheet for Patients: PodPark.tn Fact Sheet for Providers: GiftContent.is  This test is not yet approved or cleared by the [place] FDA and  has been authorized for detection and/or diagnosis of SARS-CoV-2 by FDA under an Emergency Use Authorization (EUA).  This EUA will remain in effect (meaning this test can be used) for the duration of  the COVID-19  declaration under Section 564(b)(1) of the Act, 21 U.S.C. section 360bbb-3(b)(1), unless the authorization is terminated or revoked sooner. Performed at [facility], [address] [place], [place] [address]          Radiology Studies: No results found.      Scheduled Meds: . dexamethasone (DECADRON) injection  6 mg Intravenous Q24H  . enoxaparin (LOVENOX) injection  60  mg Subcutaneous Q24H  . insulin aspart  0-9 Units Subcutaneous TID WC  . linagliptin  5 mg Oral Daily  . sodium chloride flush  3 mL Intravenous Q12H   Continuous Infusions: . remdesivir 100 mg in NS 100 mL 100 mg (08/24/19 1048)     LOS: 2 days     Georgette Shell, MD 08/24/2019, 12:18 PM

## 2019-08-24 NOTE — Plan of Care (Signed)
  Problem: Education: Goal: Knowledge of risk factors and measures for prevention of condition will improve Outcome: Progressing   Problem: Coping: Goal: Psychosocial and spiritual needs will be supported Outcome: Progressing   Problem: Respiratory: Goal: Will maintain a patent airway Outcome: Progressing Goal: Complications related to the disease process, condition or treatment will be avoided or minimized Outcome: Progressing   

## 2019-08-25 LAB — CBC WITH DIFFERENTIAL/PLATELET
Abs Immature Granulocytes: 0.41 10*3/uL — ABNORMAL HIGH (ref 0.00–0.07)
Basophils Absolute: 0 10*3/uL (ref 0.0–0.1)
Basophils Relative: 0 %
Eosinophils Absolute: 0 10*3/uL (ref 0.0–0.5)
Eosinophils Relative: 0 %
HCT: 35 % — ABNORMAL LOW (ref 36.0–46.0)
Hemoglobin: 10.2 g/dL — ABNORMAL LOW (ref 12.0–15.0)
Immature Granulocytes: 3 %
Lymphocytes Relative: 16 %
Lymphs Abs: 1.9 10*3/uL (ref 0.7–4.0)
MCH: 22.9 pg — ABNORMAL LOW (ref 26.0–34.0)
MCHC: 29.1 g/dL — ABNORMAL LOW (ref 30.0–36.0)
MCV: 78.5 fL — ABNORMAL LOW (ref 80.0–100.0)
Monocytes Absolute: 0.8 10*3/uL (ref 0.1–1.0)
Monocytes Relative: 6 %
Neutro Abs: 9 10*3/uL — ABNORMAL HIGH (ref 1.7–7.7)
Neutrophils Relative %: 75 %
Platelets: 363 10*3/uL (ref 150–400)
RBC: 4.46 MIL/uL (ref 3.87–5.11)
RDW: 16.7 % — ABNORMAL HIGH (ref 11.5–15.5)
WBC: 12.1 10*3/uL — ABNORMAL HIGH (ref 4.0–10.5)
nRBC: 0.4 % — ABNORMAL HIGH (ref 0.0–0.2)

## 2019-08-25 LAB — COMPREHENSIVE METABOLIC PANEL
ALT: 27 U/L (ref 0–44)
AST: 34 U/L (ref 15–41)
Albumin: 3 g/dL — ABNORMAL LOW (ref 3.5–5.0)
Alkaline Phosphatase: 32 U/L — ABNORMAL LOW (ref 38–126)
Anion gap: 9 (ref 5–15)
BUN: 18 mg/dL (ref 6–20)
CO2: 28 mmol/L (ref 22–32)
Calcium: 9.2 mg/dL (ref 8.9–10.3)
Chloride: 105 mmol/L (ref 98–111)
Creatinine, Ser: 0.72 mg/dL (ref 0.44–1.00)
GFR calc Af Amer: >60 mL/min (ref >60)
GFR calc non Af Amer: >60 mL/min (ref >60)
Glucose, Bld: 113 mg/dL — ABNORMAL HIGH (ref 70–99)
Potassium: 4.4 mmol/L (ref 3.5–5.1)
Sodium: 142 mmol/L (ref 135–145)
Total Bilirubin: 0.2 mg/dL — ABNORMAL LOW (ref 0.3–1.2)
Total Protein: 7 g/dL (ref 6.5–8.1)

## 2019-08-25 LAB — D-DIMER, QUANTITATIVE: D-Dimer, Quant: 1.11 ug/mL-FEU — ABNORMAL HIGH (ref 0.00–0.50)

## 2019-08-25 LAB — GLUCOSE, CAPILLARY
Glucose-Capillary: 113 mg/dL — ABNORMAL HIGH (ref 70–99)
Glucose-Capillary: 84 mg/dL (ref 70–99)
Glucose-Capillary: 114 mg/dL — ABNORMAL HIGH (ref 70–99)
Glucose-Capillary: 144 mg/dL — ABNORMAL HIGH (ref 70–99)

## 2019-08-25 LAB — FERRITIN: Ferritin: 51 ng/mL (ref 11–307)

## 2019-08-25 LAB — C-REACTIVE PROTEIN: CRP: 1.8 mg/dL — ABNORMAL HIGH (ref <1.0)

## 2019-08-25 NOTE — Progress Notes (Signed)
PROGRESS NOTE    Colleen Schultz  ZMO:[id number] DOB: 07/17/1985 DOA: 08/22/2019 PCP: [facility], [facility]   Brief Narrative:34 y.o.femalewith medical history significant forhypertension, type 2 diabetes mellitus, BMI 50, and hidradenitis, now presenting to the emergency department with aches, cough, fevers, chills, nausea, vomiting, and progressive shortness of breath. Symptoms began around 08/14/2019, she was seen in the emergency department on 08/19/2019 and diagnosed with COVID-19 at that time with viral pneumonia type findings on chest x-ray, but was stable for discharge home then. She has since experienced progressive worsening and returns to the ED. She has also developed some nausea and loose stools now without abdominal pain. She continues to have general aches and subjective fevers. Her cough persists and is non-productive. She has been using Tylenol at home.  [facility]Upon arrival to the ED, patient is found to be afebrile, requiring 3 L/min of supplemental oxygen, tachypneic, and with stable blood pressure. EKG features sinus rhythm and chest x-ray with patchy bilateral airspace opacities and consolidations. Chemistry panel with mild elevation in AST. CBC with stable chronic microcytic anemia. D-dimer elevated to 1.27 and CRP elevated to 10.5. Procalcitonin is undetectable. Patient was started on supplemental oxygen and treated with a liter of normal saline, Decadron, remdesivir, fentanyl, acetaminophen, cough syrup, and Zofran. Assessment & Plan:   Principal Problem:   Acute respiratory disease due to COVID-19 virus Active Problems:   Diabetes mellitus without complication ([place])   Hypertension   Anemia   Pneumonia due to COVID-19 virus   #1 acute hypoxic respiratory failure secondary to COVID-19 pneumonia-she is on 3 L of nasal cannula.  Upon trying to titrate down her sats dropped to the mid 54s.  She is also complaining of shortness of breath  with dyspnea on exertion with any movement.   Continue remdesivir and Decadron. Her CRP is 1.8 down from 4.5 down from 10.7. D-dimer 1.11 from 1.27 upon admission. Chest x-ray shows infiltrates consistent with Covid pneumonia. If she gets more hypoxic with CRP trending up consider Actemra. She is certainly at high risk for decompensation with comorbidities.  #2 type 2 diabetes continue SSI. On Tradjenta. Metformin on hold  #3 history of essential hypertension pressure 121/68 continue to hold antihypertensives.    #4 morbid obesity BMI 50.48.   Estimated body mass index is 50.48 kg/m as calculated from the following:   Height as of this encounter: 5' 2"  (1.575 m).   Weight as of this encounter: 125.2 kg.  DVT prophylaxis:Lovenox  code Statusfull code Family Communication:None Disposition Plan:Patient came from home Plan discharge home Barriers to discharge acute hypoxia secondary to Covid pneumonia Consultants:  None  Procedures:None Antimicrobials:None   Subjective:  Patient resting in bed on her back  Stressed importance of sleeping on her belly in prone position she reports it is not comfortable stressed the importance of using incentive spirometry she was short of breath with speaking and with any minimal movements Objective: Vitals:   08/24/19 1303 08/24/19 1306 08/24/19 2057 08/25/19 0620  BP:  94/65 130/70 121/68  Pulse:  (!) 51 62 (!) 51  Resp: 18 18 20  (!) 22  Temp: (!) 97.4 F (36.3 C) (!) 97.4 F (36.3 C) 98.3 F (36.8 C) (!) 97.4 F (36.3 C)  TempSrc: Oral  Oral Oral  SpO2:  96% 95% 95%  Weight:      Height:        Intake/Output Summary (Last 24 hours) at 08/25/2019 1227 Last data filed at 08/25/2019 0800  Gross per 24 hour  Intake 3 ml  Output --  Net 3 ml   Filed Weights   08/22/19 0014 08/22/19 2035  Weight: 125 kg 125.2 kg    Examination:  General exam: Appears calm and comfortable  Respiratory system: Scattered rhonchi  bilaterally to auscultation. Respiratory effort normal. Cardiovascular system: S1 & S2 heard, RRR. No JVD, murmurs, rubs, gallops or clicks. No pedal edema. Gastrointestinal system: Abdomen is nondistended, soft and nontender. No organomegaly or masses felt. Normal bowel sounds heard. Central nervous system: Alert and oriented. No focal neurological deficits. Extremities: Symmetric 5 x 5 power. Skin: No rashes, lesions or ulcers Psychiatry: Judgement and insight appear normal. Mood & affect appropriate.     Data Reviewed: I have personally reviewed following labs and imaging studies  CBC: Recent Labs  Lab 08/19/19 2108 08/22/19 0024 08/23/19 0411 08/24/19 0354 08/25/19 0406  WBC 4.5 5.9 4.8 9.5 12.1*  NEUTROABS 3.0 4.5 3.8 7.7 9.0*  HGB 10.8* 10.5* 10.1* 10.5* 10.2*  HCT 35.4* 35.3* 34.3* 35.9* 35.0*  MCV 76.6* 77.8* 77.6* 78.6* 78.5*  PLT 242 222 274 332 440   Basic Metabolic Panel: Recent Labs  Lab 08/19/19 2108 08/22/19 0024 08/23/19 0411 08/24/19 0354 08/25/19 0406  NA 134* 141 140 138 142  K 3.3* 3.8 4.3 4.5 4.4  CL 103 108 106 103 105  CO2 24 24 26 25 28   GLUCOSE 109* 106* 127* 153* 113*  BUN 6 5* 9 17 18   CREATININE 1.13* 0.94 0.71 0.83 0.72  CALCIUM 8.3* 8.0* 8.5* 8.7* 9.2  MG  --   --  2.4  --   --    GFR: Estimated Creatinine Clearance: 127.7 mL/min (by C-G formula based on SCr of 0.72 mg/dL). Liver Function Tests: Recent Labs  Lab 08/19/19 2108 08/22/19 0024 08/23/19 0411 08/24/19 0354 08/25/19 0406  AST 38 73* 57* 42* 34  ALT 17 27 28 29 27   ALKPHOS 39 34* 30* 32* 32*  BILITOT 0.4 0.7 0.4 0.4 0.2*  PROT 7.5 7.6 7.3 7.2 7.0  ALBUMIN 3.5 3.4* 3.1* 3.0* 3.0*   No results for input(s): LIPASE, AMYLASE in the last 168 hours. No results for input(s): AMMONIA in the last 168 hours. Coagulation Profile: No results for input(s): INR, PROTIME in the last 168 hours. Cardiac Enzymes: No results for input(s): CKTOTAL, CKMB, CKMBINDEX, TROPONINI in the  last 168 hours. BNP (last 3 results) No results for input(s): PROBNP in the last 8760 hours. HbA1C: No results for input(s): HGBA1C in the last 72 hours. CBG: Recent Labs  Lab 08/24/19 0757 08/24/19 1226 08/24/19 2216 08/25/19 0806 08/25/19 1150  GLUCAP 129* 165* 142* 113* 84   Lipid Profile: No results for input(s): CHOL, HDL, LDLCALC, TRIG, CHOLHDL, LDLDIRECT in the last 72 hours. Thyroid Function Tests: No results for input(s): TSH, T4TOTAL, FREET4, T3FREE, THYROIDAB in the last 72 hours. Anemia Panel: Recent Labs    08/24/19 0354 08/25/19 0406  FERRITIN 68 51   Sepsis Labs: Recent Labs  Lab 08/22/19 0015 08/22/19 0235 08/22/19 0344  PROCALCITON  --   --  <0.10  LATICACIDVEN 1.3 1.3  --     Recent Results (from the past 240 hour(s))  SARS Coronavirus 2 Ag (30 min TAT) - Nasal Swab (BD Veritor Kit)     Status: Abnormal   Collection Time: 08/19/19  9:08 PM   Specimen: Nasal Swab (BD Veritor Kit)  Result Value Ref Range Status   SARS Coronavirus 2 Ag POSITIVE (A) NEGATIVE  Final    Comment: RESULT CALLED TO, READ BACK BY AND VERIFIED WITH: NEAL,K AT 2130 ON [id number] BY CHERESNOWSKY,T (NOTE) SARS-CoV-2 antigen PRESENT. Positive results indicate the presence of viral antigens, but clinical correlation with patient history and other diagnostic information is necessary to determine patient infection status.  Positive results do not rule out bacterial infection or co-infection  with other viruses. False positive results are rare but can occur, and confirmatory RT-PCR testing may be appropriate in some circumstances. The expected result is Negative. Fact Sheet for Patients: PodPark.tn Fact Sheet for Providers: GiftContent.is  This test is not yet approved or cleared by the [place] FDA and  has been authorized for detection and/or diagnosis of SARS-CoV-2 by FDA under an Emergency Use Authorization (EUA).   This EUA will remain in effect (meaning this test can be used) for the duration of  the COVID-19  declaration under Section 564(b)(1) of the Act, 21 U.S.C. section 360bbb-3(b)(1), unless the authorization is terminated or revoked sooner. Performed at [facility], [address] [place],  [address]          Radiology Studies: No results found.      Scheduled Meds: . dexamethasone (DECADRON) injection  6 mg Intravenous Q24H  . enoxaparin (LOVENOX) injection  60 mg Subcutaneous Q24H  . insulin aspart  0-9 Units Subcutaneous TID WC  . [START ON 08/27/2019] letrozole  2.5 mg Oral Daily  . linagliptin  5 mg Oral Daily  . sodium chloride flush  3 mL Intravenous Q12H   Continuous Infusions: . remdesivir 100 mg in NS 100 mL 100 mg (08/25/19 1058)     LOS: 3 days     Georgette Shell, MD  08/25/2019, 12:27 PM

## 2019-08-26 LAB — GLUCOSE, CAPILLARY
Glucose-Capillary: 134 mg/dL — ABNORMAL HIGH (ref 70–99)
Glucose-Capillary: 122 mg/dL — ABNORMAL HIGH (ref 70–99)
Glucose-Capillary: 146 mg/dL — ABNORMAL HIGH (ref 70–99)

## 2019-08-26 LAB — COMPREHENSIVE METABOLIC PANEL
ALT: 27 U/L (ref 0–44)
AST: 29 U/L (ref 15–41)
Albumin: 2.9 g/dL — ABNORMAL LOW (ref 3.5–5.0)
Alkaline Phosphatase: 30 U/L — ABNORMAL LOW (ref 38–126)
Anion gap: 9 (ref 5–15)
BUN: 19 mg/dL (ref 6–20)
CO2: 25 mmol/L (ref 22–32)
Calcium: 8.9 mg/dL (ref 8.9–10.3)
Chloride: 102 mmol/L (ref 98–111)
Creatinine, Ser: 0.64 mg/dL (ref 0.44–1.00)
GFR calc Af Amer: >60 mL/min (ref >60)
GFR calc non Af Amer: >60 mL/min (ref >60)
Glucose, Bld: 131 mg/dL — ABNORMAL HIGH (ref 70–99)
Potassium: 4.1 mmol/L (ref 3.5–5.1)
Sodium: 136 mmol/L (ref 135–145)
Total Bilirubin: 0.4 mg/dL (ref 0.3–1.2)
Total Protein: 6.9 g/dL (ref 6.5–8.1)

## 2019-08-26 LAB — CBC WITH DIFFERENTIAL/PLATELET
Abs Immature Granulocytes: 0.71 10*3/uL — ABNORMAL HIGH (ref 0.00–0.07)
Basophils Absolute: 0.1 10*3/uL (ref 0.0–0.1)
Basophils Relative: 1 %
Eosinophils Absolute: 0 10*3/uL (ref 0.0–0.5)
Eosinophils Relative: 0 %
HCT: 35.6 % — ABNORMAL LOW (ref 36.0–46.0)
Hemoglobin: 10.2 g/dL — ABNORMAL LOW (ref 12.0–15.0)
Immature Granulocytes: 5 %
Lymphocytes Relative: 15 %
Lymphs Abs: 2.1 10*3/uL (ref 0.7–4.0)
MCH: 22.8 pg — ABNORMAL LOW (ref 26.0–34.0)
MCHC: 28.7 g/dL — ABNORMAL LOW (ref 30.0–36.0)
MCV: 79.5 fL — ABNORMAL LOW (ref 80.0–100.0)
Monocytes Absolute: 0.8 10*3/uL (ref 0.1–1.0)
Monocytes Relative: 6 %
Neutro Abs: 10.3 10*3/uL — ABNORMAL HIGH (ref 1.7–7.7)
Neutrophils Relative %: 73 %
Platelets: 417 10*3/uL — ABNORMAL HIGH (ref 150–400)
RBC: 4.48 MIL/uL (ref 3.87–5.11)
RDW: 16.5 % — ABNORMAL HIGH (ref 11.5–15.5)
WBC: 14 10*3/uL — ABNORMAL HIGH (ref 4.0–10.5)
nRBC: 0.3 % — ABNORMAL HIGH (ref 0.0–0.2)

## 2019-08-26 LAB — D-DIMER, QUANTITATIVE: D-Dimer, Quant: 1.23 ug/mL-FEU — ABNORMAL HIGH (ref 0.00–0.50)

## 2019-08-26 LAB — C-REACTIVE PROTEIN: CRP: 0.9 mg/dL (ref <1.0)

## 2019-08-26 LAB — FERRITIN: Ferritin: 38 ng/mL (ref 11–307)

## 2019-08-26 MED ORDER — ALBUTEROL SULFATE HFA 108 (90 BASE) MCG/ACT IN AERS
1.0000 | INHALATION_SPRAY | Freq: Four times a day (QID) | RESPIRATORY_TRACT | Status: DC
  Administered 2019-08-26 – 2019-08-31 (×19): 1 via RESPIRATORY_TRACT
  Filled 2019-08-26: qty 6.7

## 2019-08-26 MED ORDER — PANTOPRAZOLE SODIUM 40 MG PO TBEC
40.0000 mg | DELAYED_RELEASE_TABLET | Freq: Two times a day (BID) | ORAL | Status: DC
  Administered 2019-08-26 – 2019-08-31 (×11): 40 mg via ORAL
  Filled 2019-08-26 (×11): qty 1

## 2019-08-26 NOTE — Plan of Care (Signed)
  Problem: Education: Goal: Knowledge of risk factors and measures for prevention of condition will improve Outcome: Progressing   Problem: Coping: Goal: Psychosocial and spiritual needs will be supported Outcome: Progressing   Problem: Respiratory: Goal: Will maintain a patent airway Outcome: Progressing Goal: Complications related to the disease process, condition or treatment will be avoided or minimized Outcome: Progressing   Problem: Education: Goal: Knowledge of General Education information will improve Description: Including pain rating scale, medication(s)/side effects and non-pharmacologic comfort measures Outcome: Progressing   Problem: Health Behavior/Discharge Planning: Goal: Ability to manage health-related needs will improve Outcome: Progressing   Problem: Clinical Measurements: Goal: Ability to maintain clinical measurements within normal limits will improve Outcome: Progressing Goal: Will remain free from infection Outcome: Progressing Goal: Respiratory complications will improve Outcome: Progressing Goal: Cardiovascular complication will be avoided Outcome: Progressing   Problem: Activity: Goal: Risk for activity intolerance will decrease Outcome: Progressing   Problem: Nutrition: Goal: Adequate nutrition will be maintained Outcome: Progressing   Problem: Coping: Goal: Level of anxiety will decrease Outcome: Progressing   Problem: Elimination: Goal: Will not experience complications related to bowel motility Outcome: Progressing Goal: Will not experience complications related to urinary retention Outcome: Progressing   Problem: Pain Managment: Goal: General experience of comfort will improve Outcome: Progressing   Problem: Safety: Goal: Ability to remain free from injury will improve Outcome: Progressing   Problem: Skin Integrity: Goal: Risk for impaired skin integrity will decrease Outcome: Completed/Met

## 2019-08-26 NOTE — Plan of Care (Signed)
  Problem: Education: Goal: Knowledge of risk factors and measures for prevention of condition will improve Outcome: Progressing   Problem: Coping: Goal: Psychosocial and spiritual needs will be supported Outcome: Progressing   Problem: Respiratory: Goal: Will maintain a patent airway Outcome: Progressing Goal: Complications related to the disease process, condition or treatment will be avoided or minimized Outcome: Progressing   

## 2019-08-26 NOTE — Progress Notes (Signed)
PROGRESS NOTE    Colleen Schultz    Code Status: Full Code  EHU:[id number] DOB: [birth year] DOA: 08/22/2019 LOS: 4 days  PCP: [facility] CC:  Chief Complaint  Patient presents with  . Emesis       Hospital Summary   This is a 34 year old female with history of hypertension, type 2 diabetes, BMI 50, hidradenitis suppurativa presented to the ED with flulike symptoms began around 4/2 and seen in the ED 4/7, diagnosed with COVID-19 at that time but discharged home at that point.  Has had worsening symptoms return to the ED, also with nausea and loose stools.  Arrived to [facility] ED on 4/10  [facility]:Upon arrival to the ED, patient is found to be afebrile, requiring 3 L/min of supplemental oxygen, tachypneic, and with stable blood pressure. EKG features sinus rhythm and chest x-ray with patchy bilateral airspace opacities and consolidations. Chemistry panel with mild elevation in AST. CBC with stable chronic microcytic anemia. D-dimer elevated to 1.27 and CRP elevated to 10.5. Procalcitonin is undetectable. Patient was started on supplemental oxygen and treated with a liter of normal saline, Decadron, remdesivir, fentanyl, acetaminophen, cough syrup, and Zofran. A & P   Principal Problem:   Acute respiratory disease due to COVID-19 virus Active Problems:   Diabetes mellitus without complication ([place])   Hypertension   Anemia   Pneumonia due to COVID-19 virus   1. Acute hypoxic tori failure secondary to COVID-19 pneumonia a. improving O2 requirements, down to 1.5 L b. Day 5/5 remdesivir, 5/10 dexamethasone c. will add on twice daily PPI as patient reports history of GERD which may exacerbate her respiratory symptoms d. Encourage incentive spirometry, lying on side and/or pronation and flutter valve e. Add on albuterol inhaler 4 times daily  2. Type 2 diabetes a. Continue sliding scale and Tradjenta, holding  Metformin  3. Hypertension a. Currently holding home lisinopril  4. Morbid obesity  5. Irregular menstrual bleeding a. Continue home letrozole  DVT prophylaxis: Lovenox Family Communication: Patient updated Disposition Plan:  Status is: Inpatient  Remains inpatient appropriate because:Inpatient level of care appropriate due to severity of illness   Dispo: The patient is from: Home              Anticipated d/c is to: Home              Anticipated d/c date is: 3 days              Patient currently is not medically stable to d/c.           Pressure injury documentation    None  Consultants  None  Procedures  None  Antibiotics   Anti-infectives (From admission, onward)   Start     Dose/Rate Route Frequency Ordered Stop   08/23/19 1000  remdesivir 100 mg in sodium chloride 0.9 % 100 mL IVPB     100 mg 200 mL/hr over 30 Minutes Intravenous Daily 08/22/19 0311 08/26/19 1110   08/22/19 0330  remdesivir 100 mg in sodium chloride 0.9 % 100 mL IVPB     100 mg 200 mL/hr over 30 Minutes Intravenous Every 30 min 08/22/19 0311 08/22/19 0519        Subjective   Reports persistent symptoms but slightly improved.  States that she has acid reflux at baseline reports exertional dyspnea.  No other complaints or events  Objective   Vitals:   08/25/19 2111 08/26/19 0521 08/26/19 0825 08/26/19 1224  BP:  106/68 121/85  108/76  Pulse: 65 (!) 51  89  Resp: _0 Temp: 97.8 F (36.6 C) (!) 97.5 F (36.4 C)  99 F (37.2 C)  TempSrc: Oral Oral  Oral  SpO2: 99% 97% 98% 96%  Weight:      Height:        Intake/Output Summary (Last 24 hours) at 08/26/2019 1705 Last data filed at 08/26/2019 1330 Gross per 24 hour  Intake 180 ml  Output --  Net 180 ml   Filed Weights   08/22/19 0014 08/22/19 2035  Weight: 125 kg 125.2 kg    Examination:  Physical Exam Vitals and nursing note reviewed.  Constitutional:      General: She is not in acute distress.     Appearance: She is obese.  HENT:     Head: Normocephalic and atraumatic.  Eyes:     Conjunctiva/sclera: Conjunctivae normal.  Cardiovascular:     Rate and Rhythm: Normal rate and regular rhythm.  Pulmonary:     Effort: Pulmonary effort is normal.     Breath sounds: Normal breath sounds.  Abdominal:     General: Abdomen is flat.     Palpations: Abdomen is soft.  Musculoskeletal:        General: No swelling or tenderness.  Skin:    Coloration: Skin is not jaundiced or pale.  Neurological:     Mental Status: She is alert. Mental status is at baseline.  Psychiatric:        Mood and Affect: Mood normal.        Behavior: Behavior normal.     Data Reviewed: I have personally reviewed following labs and imaging studies  CBC: Recent Labs  Lab 08/22/19 0024 08/23/19 0411 08/24/19 0354 08/25/19 0406 08/26/19 0509  WBC 5.9 4.8 9.5 12.1* 14.0*  NEUTROABS 4.5 3.8 7.7 9.0* 10.3*  HGB 10.5* 10.1* 10.5* 10.2* 10.2*  HCT 35.3* 34.3* 35.9* 35.0* 35.6*  MCV 77.8* 77.6* 78.6* 78.5* 79.5*  PLT 222 274 332 363 917*   Basic Metabolic Panel: Recent Labs  Lab 08/22/19 0024 08/23/19 0411 08/24/19 0354 08/25/19 0406 08/26/19 0509  NA 141 140 138 142 136  K 3.8 4.3 4.5 4.4 4.1  CL 108 106 103 105 102  CO2 _1 GLUCOSE 106* 127* 153* 113* 131*  BUN 5* _2 CREATININE 0.94 0.71 0.83 0.72 0.64  CALCIUM 8.0* 8.5* 8.7* 9.2 8.9  MG  --  2.4  --   --   --    GFR: Estimated Creatinine Clearance: 127.7 mL/min (by C-G formula based on SCr of 0.64 mg/dL). Liver Function Tests: Recent Labs  Lab 08/22/19 0024 08/23/19 0411 08/24/19 0354 08/25/19 0406 08/26/19 0509  AST 73* 57* 42* 34 29  ALT _3 ALKPHOS 34* 30* 32* 32* 30*  BILITOT 0.7 0.4 0.4 0.2* 0.4  PROT 7.6 7.3 7.2 7.0 6.9  ALBUMIN 3.4* 3.1* 3.0* 3.0* 2.9*   No results for input(s): LIPASE, AMYLASE in the last 168 hours. No results for input(s): AMMONIA in the last 168 hours. Coagulation  Profile: No results for input(s): INR, PROTIME in the last 168 hours. Cardiac Enzymes: No results for input(s): CKTOTAL, CKMB, CKMBINDEX, TROPONINI in the last 168 hours. BNP (last 3 results) No results for input(s): PROBNP in the last 8760 hours. HbA1C: No results for input(s): HGBA1C in the last 72 hours. CBG: Recent Labs  Lab 08/25/19 1150 08/25/19  1706 08/25/19 2106 08/26/19 0758 08/26/19 1155  GLUCAP 84 114* 144* 134* 122*   Lipid Profile: No results for input(s): CHOL, HDL, LDLCALC, TRIG, CHOLHDL, LDLDIRECT in the last 72 hours. Thyroid Function Tests: No results for input(s): TSH, T4TOTAL, FREET4, T3FREE, THYROIDAB in the last 72 hours. Anemia Panel: Recent Labs    08/25/19 0406 08/26/19 0509  FERRITIN 51 38   Sepsis Labs: Recent Labs  Lab 08/22/19 0015 08/22/19 0235 08/22/19 0344  PROCALCITON  --   --  <0.10  LATICACIDVEN 1.3 1.3  --     Recent Results (from the past 240 hour(s))  SARS Coronavirus 2 Ag (30 min TAT) - Nasal Swab (BD Veritor Kit)     Status: Abnormal   Collection Time: 08/19/19  9:08 PM   Specimen: Nasal Swab (BD Veritor Kit)  Result Value Ref Range Status   SARS Coronavirus 2 Ag POSITIVE (A) NEGATIVE Final    Comment: RESULT CALLED TO, READ BACK BY AND VERIFIED WITH: NEAL,K AT 2130 ON [id number] BY CHERESNOWSKY,T (NOTE) SARS-CoV-2 antigen PRESENT. Positive results indicate the presence of viral antigens, but clinical correlation with patient history and other diagnostic information is necessary to determine patient infection status.  Positive results do not rule out bacterial infection or co-infection  with other viruses. False positive results are rare but can occur, and confirmatory RT-PCR testing may be appropriate in some circumstances. The expected result is Negative. Fact Sheet for Patients: PodPark.tn Fact Sheet for Providers: GiftContent.is  This test is not yet approved or  cleared by the [place] FDA and  has been authorized for detection and/or diagnosis of SARS-CoV-2 by FDA under an Emergency Use Authorization (EUA).  This EUA will remain in effect (meaning this test can be used) for the duration of  the COVID-19  declaration under Section 564(b)(1) of the Act, 21 U.S.C. section 360bbb-3(b)(1), unless the authorization is terminated or revoked sooner. Performed at [facility], [address], [place], [place] [address]          Radiology Studies: No results found.      Scheduled Meds: . dexamethasone (DECADRON) injection  6 mg Intravenous Q24H  . enoxaparin (LOVENOX) injection  60 mg Subcutaneous Q24H  . insulin aspart  0-9 Units Subcutaneous TID WC  . [START ON 08/27/2019] letrozole  2.5 mg Oral Daily  . linagliptin  5 mg Oral Daily  . pantoprazole  40 mg Oral BID  . sodium chloride flush  3 mL Intravenous Q12H   Continuous Infusions:   Time spent: 25 minutes with over 50% of the time coordinating the patient's care    Harold Hedge, DO [facility] Pager [phone]  Call night coverage person covering after 7pm

## 2019-08-26 NOTE — Plan of Care (Signed)
  Problem: Education: Goal: Knowledge of risk factors and measures for prevention of condition will improve Outcome: Progressing   Problem: Coping: Goal: Psychosocial and spiritual needs will be supported Outcome: Progressing   Problem: Respiratory: Goal: Will maintain a patent airway Outcome: Progressing Goal: Complications related to the disease process, condition or treatment will be avoided or minimized Outcome: Progressing   Problem: Education: Goal: Knowledge of General Education information will improve Description: Including pain rating scale, medication(s)/side effects and non-pharmacologic comfort measures Outcome: Progressing   Problem: Health Behavior/Discharge Planning: Goal: Ability to manage health-related needs will improve Outcome: Progressing   Problem: Clinical Measurements: Goal: Ability to maintain clinical measurements within normal limits will improve Outcome: Progressing Goal: Will remain free from infection Outcome: Progressing Goal: Diagnostic test results will improve Outcome: Progressing Goal: Respiratory complications will improve Outcome: Progressing Goal: Cardiovascular complication will be avoided Outcome: Progressing   Problem: Activity: Goal: Risk for activity intolerance will decrease Outcome: Progressing   Problem: Nutrition: Goal: Adequate nutrition will be maintained Outcome: Progressing   Problem: Coping: Goal: Level of anxiety will decrease Outcome: Progressing   Problem: Elimination: Goal: Will not experience complications related to bowel motility Outcome: Progressing Goal: Will not experience complications related to urinary retention Outcome: Progressing   Problem: Pain Managment: Goal: General experience of comfort will improve Outcome: Progressing   Problem: Safety: Goal: Ability to remain free from injury will improve Outcome: Progressing   

## 2019-08-27 LAB — COMPREHENSIVE METABOLIC PANEL
ALT: 26 U/L (ref 0–44)
AST: 26 U/L (ref 15–41)
Albumin: 3.1 g/dL — ABNORMAL LOW (ref 3.5–5.0)
Alkaline Phosphatase: 37 U/L — ABNORMAL LOW (ref 38–126)
Anion gap: 11 (ref 5–15)
BUN: 20 mg/dL (ref 6–20)
CO2: 25 mmol/L (ref 22–32)
Calcium: 9 mg/dL (ref 8.9–10.3)
Chloride: 102 mmol/L (ref 98–111)
Creatinine, Ser: 0.86 mg/dL (ref 0.44–1.00)
GFR calc Af Amer: >60 mL/min (ref >60)
GFR calc non Af Amer: >60 mL/min (ref >60)
Glucose, Bld: 111 mg/dL — ABNORMAL HIGH (ref 70–99)
Potassium: 4.3 mmol/L (ref 3.5–5.1)
Sodium: 138 mmol/L (ref 135–145)
Total Bilirubin: 0.3 mg/dL (ref 0.3–1.2)
Total Protein: 6.8 g/dL (ref 6.5–8.1)

## 2019-08-27 LAB — GLUCOSE, CAPILLARY
Glucose-Capillary: 98 mg/dL (ref 70–99)
Glucose-Capillary: 159 mg/dL — ABNORMAL HIGH (ref 70–99)
Glucose-Capillary: 118 mg/dL — ABNORMAL HIGH (ref 70–99)
Glucose-Capillary: 200 mg/dL — ABNORMAL HIGH (ref 70–99)

## 2019-08-27 LAB — CBC WITH DIFFERENTIAL/PLATELET
Abs Immature Granulocytes: 0.96 10*3/uL — ABNORMAL HIGH (ref 0.00–0.07)
Basophils Absolute: 0.1 10*3/uL (ref 0.0–0.1)
Basophils Relative: 1 %
Eosinophils Absolute: 0 10*3/uL (ref 0.0–0.5)
Eosinophils Relative: 0 %
HCT: 35.3 % — ABNORMAL LOW (ref 36.0–46.0)
Hemoglobin: 10.4 g/dL — ABNORMAL LOW (ref 12.0–15.0)
Immature Granulocytes: 8 %
Lymphocytes Relative: 17 %
Lymphs Abs: 2.1 10*3/uL (ref 0.7–4.0)
MCH: 22.8 pg — ABNORMAL LOW (ref 26.0–34.0)
MCHC: 29.5 g/dL — ABNORMAL LOW (ref 30.0–36.0)
MCV: 77.2 fL — ABNORMAL LOW (ref 80.0–100.0)
Monocytes Absolute: 0.8 10*3/uL (ref 0.1–1.0)
Monocytes Relative: 6 %
Neutro Abs: 8.6 10*3/uL — ABNORMAL HIGH (ref 1.7–7.7)
Neutrophils Relative %: 68 %
Platelets: 427 10*3/uL — ABNORMAL HIGH (ref 150–400)
RBC: 4.57 MIL/uL (ref 3.87–5.11)
RDW: 16.9 % — ABNORMAL HIGH (ref 11.5–15.5)
WBC: 12.5 10*3/uL — ABNORMAL HIGH (ref 4.0–10.5)
nRBC: 0.5 % — ABNORMAL HIGH (ref 0.0–0.2)

## 2019-08-27 LAB — FERRITIN: Ferritin: 32 ng/mL (ref 11–307)

## 2019-08-27 LAB — D-DIMER, QUANTITATIVE: D-Dimer, Quant: 1.23 ug/mL-FEU — ABNORMAL HIGH (ref 0.00–0.50)

## 2019-08-27 LAB — C-REACTIVE PROTEIN: CRP: 1.1 mg/dL — ABNORMAL HIGH (ref <1.0)

## 2019-08-27 NOTE — Progress Notes (Signed)
PROGRESS NOTE    ADALIE MAND    Code Status: Full Code  STM:[id number] DOB: 17-Dec-1985 DOA: 08/22/2019 LOS: 5 days  [facility]P: [facility] CC:  Chief Complaint  Patient presents with  . Emesis       Hospital Summary   This is a 34 year old female with history of hypertension, type 2 diabetes, BMI 50, hidradenitis suppurativa presented to the ED with flulike symptoms began around 4/2 and seen in the ED 4/7, diagnosed with COVID-19 at that time but discharged home at that point.  Has had worsening symptoms return to the ED, also with nausea and loose stools.  [facility] arrival to the ED, patient is found to be afebrile, requiring 3 L/min of supplemental oxygen, tachypneic, and with stable blood pressure. EKG features sinus rhythm and chest x-ray with patchy bilateral airspace opacities and consolidations. Chemistry panel with mild elevation in AST. CBC with stable chronic microcytic anemia. D-dimer elevated to 1.27 and CRP elevated to 10.5. Procalcitonin is undetectable. Patient was started on supplemental oxygen and treated with a liter of normal saline, Decadron, remdesivir, fentanyl, acetaminophen, cough syrup, and Zofran. A & P   Principal Problem:   Acute respiratory disease due to COVID-19 virus Active Problems:   Diabetes mellitus without complication ([place])   Hypertension   Anemia   Pneumonia due to COVID-19 virus   1. Acute hypoxic tori failure secondary to COVID-19 pneumonia a. Increased O2 requirement from 1.5->3L however she states that she is symptomatically improving b. Day 5/5 remdesivir, 6/10 dexamethasone c. Continue twice daily PPI as patient reports history of GERD which may exacerbate her respiratory symptoms d. Encourage incentive spirometry, lying on side and/or pronation and flutter valve e. Add on albuterol inhaler 4 times daily  2. Type 2 diabetes a. Continue sliding scale and  Tradjenta, holding Metformin  3. Hypertension a. Currently holding home lisinopril  4. Morbid obesity  5. Irregular menstrual bleeding a. Continue home letrozole  DVT prophylaxis: Lovenox Family Communication: Patient updated Disposition Plan:  Status is: Inpatient  Remains inpatient appropriate because:Inpatient level of care appropriate due to severity of illness   Dispo: The patient is from: Home              Anticipated d/c is to: Home              Anticipated d/c date is: 2 days              Patient currently is not medically stable to d/c.                 Pressure injury documentation    None  Consultants  None  Procedures  None  Antibiotics   Anti-infectives (From admission, onward)   Start     Dose/Rate Route Frequency Ordered Stop   08/23/19 1000  remdesivir 100 mg in sodium chloride 0.9 % 100 mL IVPB     100 mg 200 mL/hr over 30 Minutes Intravenous Daily 08/22/19 0311 08/26/19 1110   08/22/19 0330  remdesivir 100 mg in sodium chloride 0.9 % 100 mL IVPB     100 mg 200 mL/hr over 30 Minutes Intravenous Every 30 min 08/22/19 0311 08/22/19 0519        Subjective   Reports her GERD and breathing are improving. States she was able to lie on her side for the first time. No overnight events, no other complaints.   Objective  Vitals:   08/26/19 1224 08/26/19 2116 08/27/19 0544 08/27/19 0930  BP: 108/76 132/74 (!) 110/47   Pulse: 89 71 (!) 52   Resp: 18 18 18    Temp: 99 F (37.2 C) 97.7 F (36.5 C) 98.2 F (36.8 C)   TempSrc: Oral Oral Oral   SpO2: 96% 97% 100% 96%  Weight:      Height:        Intake/Output Summary (Last 24 hours) at 08/27/2019 1337 Last data filed at 08/26/2019 1800 Gross per 24 hour  Intake 80 ml  Output --  Net 80 ml   Filed Weights   08/22/19 0014 08/22/19 2035  Weight: 125 kg 125.2 kg    Examination:  Physical Exam Vitals and nursing note reviewed.  Constitutional:      Appearance: She is obese.   HENT:     Head: Normocephalic and atraumatic.  Eyes:     Conjunctiva/sclera: Conjunctivae normal.  Cardiovascular:     Rate and Rhythm: Normal rate and regular rhythm.  Pulmonary:     Effort: Pulmonary effort is normal. No respiratory distress.     Breath sounds: No wheezing.  Abdominal:     General: Abdomen is flat.     Palpations: Abdomen is soft.  Musculoskeletal:        General: No swelling or tenderness.  Skin:    Coloration: Skin is not jaundiced or pale.  Neurological:     Mental Status: She is alert. Mental status is at baseline.  Psychiatric:        Mood and Affect: Mood normal.        Behavior: Behavior normal.     Data Reviewed: I have personally reviewed following labs and imaging studies  CBC: Recent Labs  Lab 08/23/19 0411 08/24/19 0354 08/25/19 0406 08/26/19 0509 08/27/19 0423  WBC 4.8 9.5 12.1* 14.0* 12.5*  NEUTROABS 3.8 7.7 9.0* 10.3* 8.6*  HGB 10.1* 10.5* 10.2* 10.2* 10.4*  HCT 34.3* 35.9* 35.0* 35.6* 35.3*  MCV 77.6* 78.6* 78.5* 79.5* 77.2*  PLT 274 332 363 417* 010*   Basic Metabolic Panel: Recent Labs  Lab 08/23/19 0411 08/24/19 0354 08/25/19 0406 08/26/19 0509 08/27/19 0423  NA 140 138 142 136 138  K 4.3 4.5 4.4 4.1 4.3  CL 106 103 105 102 102  CO2 26 25 28 25 25   GLUCOSE 127* 153* 113* 131* 111*  BUN 9 17 18 19 20   CREATININE 0.71 0.83 0.72 0.64 0.86  CALCIUM 8.5* 8.7* 9.2 8.9 9.0  MG 2.4  --   --   --   --    GFR: Estimated Creatinine Clearance: 118.8 mL/min (by C-G formula based on SCr of 0.86 mg/dL). Liver Function Tests: Recent Labs  Lab 08/23/19 0411 08/24/19 0354 08/25/19 0406 08/26/19 0509 08/27/19 0423  AST 57* 42* 34 29 26  ALT 28 29 27 27 26   ALKPHOS 30* 32* 32* 30* 37*  BILITOT 0.4 0.4 0.2* 0.4 0.3  PROT 7.3 7.2 7.0 6.9 6.8  ALBUMIN 3.1* 3.0* 3.0* 2.9* 3.1*   No results for input(s): LIPASE, AMYLASE in the last 168 hours. No results for input(s): AMMONIA in the last 168 hours. Coagulation Profile: No  results for input(s): INR, PROTIME in the last 168 hours. Cardiac Enzymes: No results for input(s): CKTOTAL, CKMB, CKMBINDEX, TROPONINI in the last 168 hours. BNP (last 3 results) No results for input(s): PROBNP in the last 8760 hours. HbA1C: No results for input(s): HGBA1C in the last 72 hours. CBG: Recent Labs  Lab 08/26/19 0758 08/26/19 1155 08/26/19 2116 08/27/19 0749 08/27/19 1212  GLUCAP 134* 122* 146* 98 159*   Lipid Profile: No results for input(s): CHOL, HDL, LDLCALC, TRIG, CHOLHDL, LDLDIRECT in the last 72 hours. Thyroid Function Tests: No results for input(s): TSH, T4TOTAL, FREET4, T3FREE, THYROIDAB in the last 72 hours. Anemia Panel: Recent Labs    08/26/19 0509 08/27/19 0423  FERRITIN 38 32   Sepsis Labs: Recent Labs  Lab 08/22/19 0015 08/22/19 0235 08/22/19 0344  PROCALCITON  --   --  <0.10  LATICACIDVEN 1.3 1.3  --     Recent Results (from the past 240 hour(s))  SARS Coronavirus 2 Ag (30 min TAT) - Nasal Swab (BD Veritor Kit)     Status: Abnormal   Collection Time: 08/19/19  9:08 PM   Specimen: Nasal Swab (BD Veritor Kit)  Result Value Ref Range Status   SARS Coronavirus 2 Ag POSITIVE (A) NEGATIVE Final    Comment: RESULT CALLED TO, READ BACK BY AND VERIFIED WITH: NEAL,K AT 2130 ON [id number] BY CHERESNOWSKY,T (NOTE) SARS-CoV-2 antigen PRESENT. Positive results indicate the presence of viral antigens, but clinical correlation with patient history and other diagnostic information is necessary to determine patient infection status.  Positive results do not rule out bacterial infection or co-infection  with other viruses. False positive results are rare but can occur, and confirmatory RT-PCR testing may be appropriate in some circumstances. The expected result is Negative. Fact Sheet for Patients: PodPark.tn Fact Sheet for Providers: GiftContent.is  This test is not yet approved or cleared by  the [place] FDA and  has been authorized for detection and/or diagnosis of SARS-CoV-2 by FDA under an Emergency Use Authorization (EUA).  This EUA will remain in effect (meaning this test can be used) for the duration of  the COVID-19  declaration under Section 564(b)(1) of the Act, 21 U.S.C. section 360bbb-3(b)(1), unless the authorization is terminated or revoked sooner. Performed at [facility], [address], [place], [place] [address]          Radiology Studies: No results found.      Scheduled Meds: . albuterol  1 puff Inhalation QID  . dexamethasone (DECADRON) injection  6 mg Intravenous Q24H  . enoxaparin (LOVENOX) injection  60 mg Subcutaneous Q24H  . insulin aspart  0-9 Units Subcutaneous TID WC  . letrozole  2.5 mg Oral Daily  . linagliptin  5 mg Oral Daily  . pantoprazole  40 mg Oral BID  . sodium chloride flush  3 mL Intravenous Q12H   Continuous Infusions:   Time spent: 26 minutes with over 50% of the time coordinating the patient's care    Harold Hedge, DO [facility] Pager [phone]  Call night coverage person covering after 7pm

## 2019-08-27 NOTE — Plan of Care (Signed)
  Problem: Education: Goal: Knowledge of risk factors and measures for prevention of condition will improve Outcome: Completed/Met   Problem: Coping: Goal: Psychosocial and spiritual needs will be supported Outcome: Progressing   Problem: Respiratory: Goal: Will maintain a patent airway Outcome: Progressing Goal: Complications related to the disease process, condition or treatment will be avoided or minimized Outcome: Progressing   Problem: Education: Goal: Knowledge of General Education information will improve Description: Including pain rating scale, medication(s)/side effects and non-pharmacologic comfort measures Outcome: Completed/Met   Problem: Health Behavior/Discharge Planning: Goal: Ability to manage health-related needs will improve Outcome: Progressing   Problem: Clinical Measurements: Goal: Ability to maintain clinical measurements within normal limits will improve Outcome: Progressing Goal: Will remain free from infection Outcome: Progressing Goal: Diagnostic test results will improve Outcome: Progressing Goal: Respiratory complications will improve Outcome: Progressing Goal: Cardiovascular complication will be avoided Outcome: Completed/Met   Problem: Nutrition: Goal: Adequate nutrition will be maintained Outcome: Progressing   Problem: Coping: Goal: Level of anxiety will decrease Outcome: Progressing   Problem: Elimination: Goal: Will not experience complications related to urinary retention Outcome: Completed/Met

## 2019-08-27 NOTE — Plan of Care (Signed)
  Problem: Coping: Goal: Psychosocial and spiritual needs will be supported Outcome: Progressing   Problem: Respiratory: Goal: Will maintain a patent airway Outcome: Progressing Goal: Complications related to the disease process, condition or treatment will be avoided or minimized Outcome: Progressing   Problem: Health Behavior/Discharge Planning: Goal: Ability to manage health-related needs will improve Outcome: Progressing   Problem: Clinical Measurements: Goal: Ability to maintain clinical measurements within normal limits will improve Outcome: Progressing Goal: Will remain free from infection Outcome: Progressing Goal: Diagnostic test results will improve Outcome: Progressing Goal: Respiratory complications will improve Outcome: Progressing   Problem: Activity: Goal: Risk for activity intolerance will decrease Outcome: Progressing   Problem: Nutrition: Goal: Adequate nutrition will be maintained Outcome: Progressing   Problem: Coping: Goal: Level of anxiety will decrease Outcome: Progressing   Problem: Elimination: Goal: Will not experience complications related to bowel motility Outcome: Progressing   Problem: Pain Managment: Goal: General experience of comfort will improve Outcome: Progressing   Problem: Safety: Goal: Ability to remain free from injury will improve Outcome: Progressing

## 2019-08-28 LAB — GLUCOSE, CAPILLARY
Glucose-Capillary: 104 mg/dL — ABNORMAL HIGH (ref 70–99)
Glucose-Capillary: 129 mg/dL — ABNORMAL HIGH (ref 70–99)
Glucose-Capillary: 145 mg/dL — ABNORMAL HIGH (ref 70–99)
Glucose-Capillary: 178 mg/dL — ABNORMAL HIGH (ref 70–99)

## 2019-08-28 MED ORDER — FUROSEMIDE 10 MG/ML IJ SOLN
20.0000 mg | Freq: Once | INTRAMUSCULAR | Status: AC
  Administered 2019-08-28: 13:00:00 20 mg via INTRAVENOUS
  Filled 2019-08-28: qty 2

## 2019-08-28 MED ORDER — POLYETHYLENE GLYCOL 3350 17 G PO PACK
17.0000 g | PACK | Freq: Every day | ORAL | Status: DC
  Administered 2019-08-28 – 2019-08-31 (×4): 17 g via ORAL
  Filled 2019-08-28 (×4): qty 1

## 2019-08-28 NOTE — Progress Notes (Signed)
PROGRESS NOTE    Colleen Schultz    Code Status: Full Code  FKC:[id number] DOB: 06/01/1985 DOA: 08/22/2019 LOS: 6 days  [facility] CC:  Chief Complaint  Patient presents with  . Emesis       Hospital Summary   This is a 34 year old female with history of hypertension, type 2 diabetes, BMI 50, hidradenitis suppurativa presented to the ED with flulike symptoms began around 4/2 and seen in the ED 4/7, diagnosed with COVID-19 at that time but discharged home at that point.  Has had worsening symptoms return to the ED, also with nausea and loose stools.  [facility] 4[facility]10  [facility]Upon arrival to the ED, patient is found to be afebrile, requiring 3 L/min of supplemental oxygen, tachypneic, and with stable blood pressure. EKG features sinus rhythm and chest x-ray with patchy bilateral airspace opacities and consolidations. Chemistry panel with mild elevation in AST. CBC with stable chronic microcytic anemia. D-dimer elevated to 1.27 and CRP elevated to 10.5. Procalcitonin is undetectable. Patient was started on supplemental oxygen and treated with a liter of normal saline, Decadron, remdesivir, fentanyl, acetaminophen, cough syrup, and Zofran. A & P   Principal Problem:   Acute respiratory disease due to COVID-19 virus Active Problems:   Diabetes mellitus without complication ([place])   Hypertension   Anemia   Pneumonia due to COVID-19 virus   1. Acute hypoxic failure secondary to COVID-19 pneumonia a. Symptomatically improving but still remains in bed with stable O2 requirements b. Day 5/5 remdesivir, 7/10 dexamethasone c. Continue twice daily PPI as patient reports history of GERD  d. Encourage incentive spirometry, lying on side and/or pronation and flutter valve e. OOB f. albuterol inhaler 4 times daily g. Will give low-dose Lasix x1 today  2. Type 2 diabetes a. Continue sliding scale and Tradjenta, holding  Metformin  3. Hypertension a. Currently holding home lisinopril  4. Morbid obesity  5. Irregular menstrual bleeding a. Continue home letrozole  DVT prophylaxis: Lovenox Family Communication: Updated husband Disposition Plan:  Status is: Inpatient.  Has had minimal exertion and still requiring nasal cannula.  If she can get up and out of bed and tolerate ambulation then hopeful discharge in next 24 to 48 hours but will likely need supplemental O2  Remains inpatient appropriate because:Inpatient level of care appropriate due to severity of illness   Dispo: The patient is from: Home              Anticipated d/c is to: Home              Anticipated d/c date is: 2 days              Patient currently is not medically stable to d/c.     Pressure injury documentation    None  Consultants  None  Procedures  None  Antibiotics   Anti-infectives (From admission, onward)   Start     Dose/Rate Route Frequency Ordered Stop   08/23/19 1000  remdesivir 100 mg in sodium chloride 0.9 % 100 mL IVPB     100 mg 200 mL/hr over 30 Minutes Intravenous Daily 08/22/19 0311 08/26/19 1110   08/22/19 0330  remdesivir 100 mg in sodium chloride 0.9 % 100 mL IVPB     100 mg 200 mL/hr over 30 Minutes Intravenous Every 30 min 08/22/19 0311 08/22/19 0519        Subjective   Symptomatically improved and states she is  able to lie on her side and tried laying on her left side for the first time.  Has not been out of bed into the chair much she states.  Still short of breath with minimal exertion to the bathroom.  Reports she has been using her incentive spirometer but has not been using flutter valve.  Also having productive cough with clear/yellow sputum.  No other complaints no overnight events Objective   Vitals:   08/27/19 0930 08/27/19 1430 08/27/19 2133 08/28/19 0457  BP:  120/72 118/69 111/63  Pulse:  70 61 (!) 56  Resp:  16 (!) 21 (!) 22  Temp:  98 F (36.7 C) 98 F (36.7 C) 98 F  (36.7 C)  TempSrc:  Oral Oral Oral  SpO2: 96% 96% 99% 98%  Weight:      Height:        Intake/Output Summary (Last 24 hours) at 08/28/2019 1358 Last data filed at 08/28/2019 1243 Gross per 24 hour  Intake 603 ml  Output --  Net 603 ml   Filed Weights   08/22/19 0014 08/22/19 2035  Weight: 125 kg 125.2 kg    Examination:  Physical Exam Vitals and nursing note reviewed.  Constitutional:      Appearance: Normal appearance.  HENT:     Head: Normocephalic and atraumatic.  Eyes:     Conjunctiva/sclera: Conjunctivae normal.  Cardiovascular:     Rate and Rhythm: Normal rate and regular rhythm.  Pulmonary:     Effort: Pulmonary effort is normal.     Breath sounds: Rales present.  Abdominal:     General: Abdomen is flat.     Palpations: Abdomen is soft.  Musculoskeletal:        General: No swelling or tenderness.  Skin:    Coloration: Skin is not jaundiced or pale.  Neurological:     Mental Status: She is alert. Mental status is at baseline.  Psychiatric:        Mood and Affect: Mood normal.        Behavior: Behavior normal.     Data Reviewed: I have personally reviewed following labs and imaging studies  CBC: Recent Labs  Lab 08/23/19 0411 08/24/19 0354 08/25/19 0406 08/26/19 0509 08/27/19 0423  WBC 4.8 9.5 12.1* 14.0* 12.5*  NEUTROABS 3.8 7.7 9.0* 10.3* 8.6*  HGB 10.1* 10.5* 10.2* 10.2* 10.4*  HCT 34.3* 35.9* 35.0* 35.6* 35.3*  MCV 77.6* 78.6* 78.5* 79.5* 77.2*  PLT 274 332 363 417* 712*   Basic Metabolic Panel: Recent Labs  Lab 08/23/19 0411 08/24/19 0354 08/25/19 0406 08/26/19 0509 08/27/19 0423  NA 140 138 142 136 138  K 4.3 4.5 4.4 4.1 4.3  CL 106 103 105 102 102  CO2 _0 GLUCOSE 127* 153* 113* 131* 111*  BUN _1 CREATININE 0.71 0.83 0.72 0.64 0.86  CALCIUM 8.5* 8.7* 9.2 8.9 9.0  MG 2.4  --   --   --   --    GFR: Estimated Creatinine Clearance: 118.8 mL/min (by C-G formula based on SCr of 0.86 mg/dL). Liver  Function Tests: Recent Labs  Lab 08/23/19 0411 08/24/19 0354 08/25/19 0406 08/26/19 0509 08/27/19 0423  AST 57* 42* 34 29 26  ALT _2 ALKPHOS 30* 32* 32* 30* 37*  BILITOT 0.4 0.4 0.2* 0.4 0.3  PROT 7.3 7.2 7.0 6.9 6.8  ALBUMIN 3.1* 3.0* 3.0* 2.9* 3.1*   No results for input(s): LIPASE,  AMYLASE in the last 168 hours. No results for input(s): AMMONIA in the last 168 hours. Coagulation Profile: No results for input(s): INR, PROTIME in the last 168 hours. Cardiac Enzymes: No results for input(s): CKTOTAL, CKMB, CKMBINDEX, TROPONINI in the last 168 hours. BNP (last 3 results) No results for input(s): PROBNP in the last 8760 hours. HbA1C: No results for input(s): HGBA1C in the last 72 hours. CBG: Recent Labs  Lab 08/27/19 1212 08/27/19 1651 08/27/19 2139 08/28/19 0755 08/28/19 1154  GLUCAP 159* 118* 200* 104* 129*   Lipid Profile: No results for input(s): CHOL, HDL, LDLCALC, TRIG, CHOLHDL, LDLDIRECT in the last 72 hours. Thyroid Function Tests: No results for input(s): TSH, T4TOTAL, FREET4, T3FREE, THYROIDAB in the last 72 hours. Anemia Panel: Recent Labs    08/26/19 0509 08/27/19 0423  FERRITIN 38 32   Sepsis Labs: Recent Labs  Lab 08/22/19 0015 08/22/19 0235 08/22/19 0344  PROCALCITON  --   --  <0.10  LATICACIDVEN 1.3 1.3  --     Recent Results (from the past 240 hour(s))  SARS Coronavirus 2 Ag (30 min TAT) - Nasal Swab (BD Veritor Kit)     Status: Abnormal   Collection Time: 08/19/19  9:08 PM   Specimen: Nasal Swab (BD Veritor Kit)  Result Value Ref Range Status   SARS Coronavirus 2 Ag POSITIVE (A) NEGATIVE Final    Comment: RESULT CALLED TO, READ BACK BY AND VERIFIED WITH: NEAL,K AT 2130 ON [id number] BY CHERESNOWSKY,T (NOTE) SARS-CoV-2 antigen PRESENT. Positive results indicate the presence of viral antigens, but clinical correlation with patient history and other diagnostic information is necessary to determine patient infection status.   Positive results do not rule out bacterial infection or co-infection  with other viruses. False positive results are rare but can occur, and confirmatory RT-PCR testing may be appropriate in some circumstances. The expected result is Negative. Fact Sheet for Patients: PodPark.tn Fact Sheet for Providers: GiftContent.is  This test is not yet approved or cleared by the [place] FDA and  has been authorized for detection and/or diagnosis of SARS-CoV-2 by FDA under an Emergency Use Authorization (EUA).  This EUA will remain in effect (meaning this test can be used) for the duration of  the COVID-19  declaration under Section 564(b)(1) of the Act, 21 U.S.C. section 360bbb-3(b)(1), unless the authorization is terminated or revoked sooner. Performed at [facility], [address] [place], [place] [address]          Radiology Studies: No results found.      Scheduled Meds: . albuterol  1 puff Inhalation QID  . dexamethasone (DECADRON) injection  6 mg Intravenous Q24H  . enoxaparin (LOVENOX) injection  60 mg Subcutaneous Q24H  . insulin aspart  0-9 Units Subcutaneous TID WC  . letrozole  2.5 mg Oral Daily  . linagliptin  5 mg Oral Daily  . pantoprazole  40 mg Oral BID  . polyethylene glycol  17 g Oral Daily  . sodium chloride flush  3 mL Intravenous Q12H   Continuous Infusions:   Time spent: 28 minutes with over 50% of the time coordinating the patient's care    Harold Hedge, DO [facility] Pager [phone]  Call night coverage person covering after 7pm

## 2019-08-29 LAB — GLUCOSE, CAPILLARY
Glucose-Capillary: 123 mg/dL — ABNORMAL HIGH (ref 70–99)
Glucose-Capillary: 125 mg/dL — ABNORMAL HIGH (ref 70–99)
Glucose-Capillary: 191 mg/dL — ABNORMAL HIGH (ref 70–99)
Glucose-Capillary: 237 mg/dL — ABNORMAL HIGH (ref 70–99)

## 2019-08-29 LAB — BASIC METABOLIC PANEL
Anion gap: 6 (ref 5–15)
BUN: 18 mg/dL (ref 6–20)
CO2: 29 mmol/L (ref 22–32)
Calcium: 9 mg/dL (ref 8.9–10.3)
Chloride: 100 mmol/L (ref 98–111)
Creatinine, Ser: 0.76 mg/dL (ref 0.44–1.00)
GFR calc Af Amer: >60 mL/min (ref >60)
GFR calc non Af Amer: >60 mL/min (ref >60)
Glucose, Bld: 171 mg/dL — ABNORMAL HIGH (ref 70–99)
Potassium: 4 mmol/L (ref 3.5–5.1)
Sodium: 135 mmol/L (ref 135–145)

## 2019-08-29 MED ORDER — ENOXAPARIN SODIUM 80 MG/0.8ML ~~LOC~~ SOLN
0.5000 mg/kg | SUBCUTANEOUS | Status: DC
  Administered 2019-08-29 – 2019-08-30 (×2): 65 mg via SUBCUTANEOUS
  Filled 2019-08-29 (×2): qty 0.8

## 2019-08-29 NOTE — Progress Notes (Signed)
ANTICOAGULATION CONSULT NOTE - Initial Consult  Pharmacy Consult for enoxaparin Indication: DVT ppx  Allergies  Allergen Reactions  . Adhesive [Tape] Itching  . Cinnamon Hives    Patient Measurements: Height: 5\' 2"  (157.5 cm) Weight: 125.2 kg (276 lb) IBW/kg (Calculated) : 50.1  Vital Signs: Temp: 98.4 F (36.9 C) (04/17 1448) Temp Source: Oral (04/17 1448) BP: 119/74 (04/17 1448) Pulse Rate: 79 (04/17 1448)  Labs: Recent Labs    08/27/19 0423 08/29/19 0419  HGB 10.4*  --   HCT 35.3*  --   PLT 427*  --   CREATININE 0.86 0.76    Estimated Creatinine Clearance: 127.7 mL/min (by C-G formula based on SCr of 0.76 mg/dL).   Medical History: Past Medical History:  Diagnosis Date  . Anemia   . Blood transfusion without reported diagnosis   . Diabetes mellitus without complication ([facility])   . Hypertension   . Obesity   . Thyroid disease    Assessment: Pharmacy consulted to dose enoxaparin for DVT prophylaxis in the setting of COVID PNA in this morbidly obese 34 year old female.  Pt was on enoxaparin 60 mg subQ daily from 4/11 - 4/15.  Today, 08/29/19  CBC: Hgb low but stable, Plt elevate (427).   SCr 0.76, CrCl > 60 mL/min  TBW = 125 kg, BMI 50.4  D-dimer <5  Plan:   Enoxaparin 65 mg (0.5 mg/kg) subQ daily for DVT ppx per COVID treatment algorithm  Recheck CBC, d-dimer with AM labs tomorrow  08/31/19, PharmD 08/29/2019,3:57 PM

## 2019-08-29 NOTE — Progress Notes (Signed)
SATURATION QUALIFICATIONS: (This note is used to comply with regulatory documentation for home oxygen)  Patient Saturations on Room Air at Rest = 95%  Patient Saturations on Room Air while Ambulating = 89-94%  Patient Saturations on 2 Liters of oxygen while Ambulating = 96%  Please briefly explain why patient needs home oxygen:  Patient reports feeling as though she could not take a full breathe in while walking without oxygen. Patient also reported feeling dizzy while walking while on RA and on oxygen.

## 2019-08-29 NOTE — Progress Notes (Addendum)
PROGRESS NOTE    Colleen Schultz    Code Status: Full Code  NID:[id number] DOB: 1985/11/17 DOA: 08/22/2019 LOS: 7 days  PCP: [facility] CC:  Chief Complaint  Patient presents with  . Emesis       Hospital Summary   This is a 34 year old female with history of hypertension, type 2 diabetes, BMI 50, hidradenitis suppurativa presented to the ED with flulike symptoms began around 4/2 and seen in the ED 4/7, diagnosed with COVID-19 at that time but discharged home at that point.  Has had worsening symptoms return to the ED, also with nausea and loose stools.  [facility]Upon arrival to the ED, patient is found to be afebrile, requiring 3 L/min of supplemental oxygen, tachypneic, and with stable blood pressure. EKG features sinus rhythm and chest x-ray with patchy bilateral airspace opacities and consolidations. Chemistry panel with mild elevation in AST. CBC with stable chronic microcytic anemia. D-dimer elevated to 1.27 and CRP elevated to 10.5. Procalcitonin is undetectable. Patient was started on supplemental oxygen and treated with a liter of normal saline, Decadron, remdesivir, fentanyl, acetaminophen, cough syrup, and Zofran. A & P   Principal Problem:   Acute respiratory disease due to COVID-19 virus Active Problems:   Diabetes mellitus without complication ([place])   Hypertension   Anemia   Pneumonia due to COVID-19 virus   1. Acute hypoxic failure secondary to COVID-19 pneumonia a. Symptomatically improving but still remains in bed with stable O2 requirements b. Completed 5 days remdesivir, day 8/10 dexamethasone c. Continue twice daily PPI as patient reports history of GERD  d. Encourage incentive spirometry, lying on side and/or pronation and flutter valve e. OOB f. albuterol inhaler 4 times daily g. Daily weight, intake/output h. Check D-dimer tomorrow as she has not had Lovenox for the past 24  hours  2. Type 2 diabetes a. Continue sliding scale and Tradjenta, holding Metformin  3. Hypertension stable  a. Currently holding home lisinopril  4. Morbid obesity  5. Irregular menstrual bleeding a. Continue home letrozole  DVT prophylaxis: Was on Lovenox but has not been getting for the past 2 days.  Will restart Family Communication: Updated husband yesterday Disposition Plan:  Status is: Inpatient.  Has had minimal ambulation and still requiring nasal cannula.  If she can get up and out of bed and tolerate ambulation then hopeful discharge in next 24 to 48 hours but will likely need supplemental O2  Remains inpatient appropriate because:Inpatient level of care appropriate due to severity of illness   Dispo: The patient is from: Home              Anticipated d/c is to: Home              Anticipated d/c date is: 2 days              Patient currently is not medically stable to d/c.     Pressure injury documentation    None  Consultants  None  Procedures  None  Antibiotics   Anti-infectives (From admission, onward)   Start     Dose/Rate Route Frequency Ordered Stop   08/23/19 1000  remdesivir 100 mg in sodium chloride 0.9 % 100 mL IVPB     100 mg 200 mL/hr over 30 Minutes Intravenous Daily 08/22/19 0311 08/26/19 1110   08/22/19 0330  remdesivir 100 mg in sodium chloride 0.9 % 100 mL IVPB  100 mg 200 mL/hr over 30 Minutes Intravenous Every 30 min 08/22/19 0311 08/22/19 0519        Subjective   States she feels symptomatically somewhat improved but does not provide much information.  Resting comfortably without overnight events.  No complaints at this time.  Objective   Vitals:   08/28/19 1410 08/28/19 2039 08/29/19 0346 08/29/19 1448  BP: 102/66 113/68 106/60 119/74  Pulse: 76 72 62 79  Resp: (!) 21 (!) _0 Temp: 98.2 F (36.8 C) 97.8 F (36.6 C) 97.8 F (36.6 C) 98.4 F (36.9 C)  TempSrc: Oral Oral Oral Oral  SpO2: 98% 98% 95% 99%   Weight:      Height:       No intake or output data in the 24 hours ending 08/29/19 1531 Filed Weights   08/22/19 0014 08/22/19 2035  Weight: 125 kg 125.2 kg    Examination:  Physical Exam Vitals and nursing note reviewed.  Constitutional:      Appearance: Normal appearance.  No distress HENT:     Head: Normocephalic and atraumatic.  Eyes:     Conjunctiva/sclera: Conjunctivae normal.  Cardiovascular:     Rate and Rhythm: Normal rate and regular rhythm.  Pulmonary:     Effort: Pulmonary effort is normal.     Breath sound no rales present, no wheeze Abdominal:     General: Abdomen is flat.     Palpations: Abdomen is soft.  Musculoskeletal:        General: No swelling or tenderness.  Skin:    Coloration: Skin is not jaundiced or pale.  Neurological:     Mental Status: She is alert. Mental status is at baseline.  Psychiatric:        Mood and Affect: Mood normal.        Behavior: Behavior normal.     Data Reviewed: I have personally reviewed following labs and imaging studies  CBC: Recent Labs  Lab 08/23/19 0411 08/24/19 0354 08/25/19 0406 08/26/19 0509 08/27/19 0423  WBC 4.8 9.5 12.1* 14.0* 12.5*  NEUTROABS 3.8 7.7 9.0* 10.3* 8.6*  HGB 10.1* 10.5* 10.2* 10.2* 10.4*  HCT 34.3* 35.9* 35.0* 35.6* 35.3*  MCV 77.6* 78.6* 78.5* 79.5* 77.2*  PLT 274 332 363 417* 883*   Basic Metabolic Panel: Recent Labs  Lab 08/23/19 0411 08/23/19 0411 08/24/19 0354 08/25/19 0406 08/26/19 0509 08/27/19 0423 08/29/19 0419  NA 140   < > 138 142 136 138 135  K 4.3   < > 4.5 4.4 4.1 4.3 4.0  CL 106   < > 103 105 102 102 100  CO2 26   < > _1 GLUCOSE 127*   < > 153* 113* 131* 111* 171*  BUN 9   < > _2 CREATININE 0.71   < > 0.83 0.72 0.64 0.86 0.76  CALCIUM 8.5*   < > 8.7* 9.2 8.9 9.0 9.0  MG 2.4  --   --   --   --   --   --    < > = values in this interval not displayed.   GFR: Estimated Creatinine Clearance: 127.7 mL/min (by C-G formula based  on SCr of 0.76 mg/dL). Liver Function Tests: Recent Labs  Lab 08/23/19 0411 08/24/19 0354 08/25/19 0406 08/26/19 0509 08/27/19 0423  AST 57* 42* 34 29 26  ALT _3 ALKPHOS 30* 32* 32* 30* 37*  BILITOT 0.4  0.4 0.2* 0.4 0.3  PROT 7.3 7.2 7.0 6.9 6.8  ALBUMIN 3.1* 3.0* 3.0* 2.9* 3.1*   No results for input(s): LIPASE, AMYLASE in the last 168 hours. No results for input(s): AMMONIA in the last 168 hours. Coagulation Profile: No results for input(s): INR, PROTIME in the last 168 hours. Cardiac Enzymes: No results for input(s): CKTOTAL, CKMB, CKMBINDEX, TROPONINI in the last 168 hours. BNP (last 3 results) No results for input(s): PROBNP in the last 8760 hours. HbA1C: No results for input(s): HGBA1C in the last 72 hours. CBG: Recent Labs  Lab 08/28/19 1154 08/28/19 1622 08/28/19 2041 08/29/19 0740 08/29/19 1205  GLUCAP 129* 145* 178* 123* 125*   Lipid Profile: No results for input(s): CHOL, HDL, LDLCALC, TRIG, CHOLHDL, LDLDIRECT in the last 72 hours. Thyroid Function Tests: No results for input(s): TSH, T4TOTAL, FREET4, T3FREE, THYROIDAB in the last 72 hours. Anemia Panel: Recent Labs    08/27/19 0423  FERRITIN 32   Sepsis Labs: No results for input(s): PROCALCITON, LATICACIDVEN in the last 168 hours.  Recent Results (from the past 240 hour(s))  SARS Coronavirus 2 Ag (30 min TAT) - Nasal Swab (BD Veritor Kit)     Status: Abnormal   Collection Time: 08/19/19  9:08 PM   Specimen: Nasal Swab (BD Veritor Kit)  Result Value Ref Range Status   SARS Coronavirus 2 Ag POSITIVE (A) NEGATIVE Final    Comment: RESULT CALLED TO, READ BACK BY AND VERIFIED WITH: NEAL,K AT 2130 ON [id number] BY CHERESNOWSKY,T (NOTE) SARS-CoV-2 antigen PRESENT. Positive results indicate the presence of viral antigens, but clinical correlation with patient history and other diagnostic information is necessary to determine patient infection status.  Positive results do not rule out  bacterial infection or co-infection  with other viruses. False positive results are rare but can occur, and confirmatory RT-PCR testing may be appropriate in some circumstances. The expected result is Negative. Fact Sheet for Patients: PodPark.tn Fact Sheet for Providers: GiftContent.is  This test is not yet approved or cleared by the [place] FDA and  has been authorized for detection and/or diagnosis of SARS-CoV-2 by FDA under an Emergency Use Authorization (EUA).  This EUA will remain in effect (meaning this test can be used) for the duration of  the COVID-19  declaration under Section 564(b)(1) of the Act, 21 U.S.C. section 360bbb-3(b)(1), unless the authorization is terminated or revoked sooner. Performed at [facility], [address], [place], [place] [address]          Radiology Studies: No results found.      Scheduled Meds: . albuterol  1 puff Inhalation QID  . dexamethasone (DECADRON) injection  6 mg Intravenous Q24H  . insulin aspart  0-9 Units Subcutaneous TID WC  . letrozole  2.5 mg Oral Daily  . linagliptin  5 mg Oral Daily  . pantoprazole  40 mg Oral BID  . polyethylene glycol  17 g Oral Daily  . sodium chloride flush  3 mL Intravenous Q12H   Continuous Infusions:   Time spent: 20 minutes with over 50% of the time coordinating the patient's care    Harold Hedge, DO [facility] Pager [phone]  Call night coverage person covering after 7pm

## 2019-08-30 DIAGNOSIS — R300 Dysuria: Secondary | ICD-10-CM

## 2019-08-30 LAB — GLUCOSE, CAPILLARY
Glucose-Capillary: 101 mg/dL — ABNORMAL HIGH (ref 70–99)
Glucose-Capillary: 175 mg/dL — ABNORMAL HIGH (ref 70–99)
Glucose-Capillary: 183 mg/dL — ABNORMAL HIGH (ref 70–99)
Glucose-Capillary: 192 mg/dL — ABNORMAL HIGH (ref 70–99)

## 2019-08-30 LAB — CBC
HCT: 36.3 % (ref 36.0–46.0)
Hemoglobin: 10.6 g/dL — ABNORMAL LOW (ref 12.0–15.0)
MCH: 22.8 pg — ABNORMAL LOW (ref 26.0–34.0)
MCHC: 29.2 g/dL — ABNORMAL LOW (ref 30.0–36.0)
MCV: 78.2 fL — ABNORMAL LOW (ref 80.0–100.0)
Platelets: 509 10*3/uL — ABNORMAL HIGH (ref 150–400)
RBC: 4.64 MIL/uL (ref 3.87–5.11)
RDW: 16.8 % — ABNORMAL HIGH (ref 11.5–15.5)
WBC: 14.6 10*3/uL — ABNORMAL HIGH (ref 4.0–10.5)
nRBC: 0 % (ref 0.0–0.2)

## 2019-08-30 LAB — D-DIMER, QUANTITATIVE: D-Dimer, Quant: 0.82 ug/mL-FEU — ABNORMAL HIGH (ref 0.00–0.50)

## 2019-08-30 LAB — URINALYSIS, ROUTINE W REFLEX MICROSCOPIC
Bilirubin Urine: NEGATIVE
Glucose, UA: NEGATIVE mg/dL
Ketones, ur: NEGATIVE mg/dL
Nitrite: NEGATIVE
Protein, ur: NEGATIVE mg/dL
RBC / HPF: >50 RBC/hpf — ABNORMAL HIGH (ref 0–5)
Specific Gravity, Urine: 1.013 (ref 1.005–1.030)
pH: 6 (ref 5.0–8.0)

## 2019-08-30 MED ORDER — DEXAMETHASONE 4 MG PO TABS
6.0000 mg | ORAL_TABLET | Freq: Once | ORAL | Status: AC
  Administered 2019-08-31: 6 mg via ORAL
  Filled 2019-08-30: qty 2

## 2019-08-30 MED ORDER — FUROSEMIDE 10 MG/ML IJ SOLN
40.0000 mg | Freq: Once | INTRAMUSCULAR | Status: AC
  Administered 2019-08-30: 40 mg via INTRAVENOUS
  Filled 2019-08-30: qty 4

## 2019-08-30 NOTE — Plan of Care (Signed)
  Problem: Coping: Goal: Psychosocial and spiritual needs will be supported Outcome: Progressing   Problem: Respiratory: Goal: Will maintain a patent airway Outcome: Progressing Goal: Complications related to the disease process, condition or treatment will be avoided or minimized Outcome: Progressing   Problem: Health Behavior/Discharge Planning: Goal: Ability to manage health-related needs will improve Outcome: Progressing   Problem: Clinical Measurements: Goal: Ability to maintain clinical measurements within normal limits will improve Outcome: Progressing Goal: Will remain free from infection Outcome: Progressing Note: Pt receiving supportive treatment for COVID infection .  Goal: Diagnostic test results will improve Outcome: Progressing Goal: Respiratory complications will improve Outcome: Progressing   Problem: Activity: Goal: Risk for activity intolerance will decrease Outcome: Progressing   Problem: Nutrition: Goal: Adequate nutrition will be maintained Outcome: Progressing   Problem: Pain Managment: Goal: General experience of comfort will improve Outcome: Progressing   Problem: Safety: Goal: Ability to remain free from injury will improve Outcome: Progressing

## 2019-08-30 NOTE — Progress Notes (Signed)
PT Cancellation Note  Patient Details Name: Colleen Schultz MRN: [id number] DOB: 15-Jul-1985   Cancelled Treatment:    Reason Eval/Treat Not Completed: PT screened, no needs identified, will sign off. Pt amb around entire unit with nursing on RA.  Please re-order if needed. Thank you   [facility] 08/30/2019, 12:38 PM

## 2019-08-30 NOTE — Progress Notes (Signed)
Pt ambulated around entire unit with slow, steady gait. O2 sat 90-95% on RA with ambulation. Pt with some SOB and needed to sit for about 3 minutes halfway around but maintained O2 sat and was able to complete the lap back to her room. SItting up in chair, eating lunch. REmains on RA, O2 sat 96%. Will monitor.

## 2019-08-30 NOTE — Progress Notes (Addendum)
PROGRESS NOTE    Colleen Schultz    Code Status: Full Code  NID:[id number] DOB: 1985/12/07 DOA: 08/22/2019 LOS: 8 days  [facility]P: [facility] CC:  Chief Complaint  Patient presents with  . Emesis       Hospital Summary   This is a 34 year old female with history of hypertension, type 2 diabetes, BMI 50, hidradenitis suppurativa presented to the ED with flulike symptoms began around 4/2 and seen in the ED 4/7, diagnosed with COVID-19 at that time but discharged home at that point.  Has had worsening symptoms return to the ED, also with nausea and loose stools.  [facility] arrival to the ED, patient is found to be afebrile, requiring 3 L/min of supplemental oxygen, tachypneic, and with stable blood pressure. EKG features sinus rhythm and chest x-ray with patchy bilateral airspace opacities and consolidations. Chemistry panel with mild elevation in AST. CBC with stable chronic microcytic anemia. D-dimer elevated to 1.27 and CRP elevated to 10.5. Procalcitonin is undetectable. Patient was started on supplemental oxygen and treated with a liter of normal saline, Decadron, remdesivir, fentanyl, acetaminophen, cough syrup, and Zofran. A & P   Principal Problem:   Acute respiratory disease due to COVID-19 virus Active Problems:   Diabetes mellitus without complication ([facility])   Hypertension   Anemia   Pneumonia due to COVID-19 virus   1. Acute hypoxic failure secondary to COVID-19 pneumonia a. Still on 2 L/min nasal cannula at rest without much improvement b. Increased weight since yesterday c. Completed 5 days remdesivir, day 9/10 dexamethasone d. Continue twice daily PPI as patient reports history of GERD  e. Encourage incentive spirometry, lying on side and/or pronation and flutter valve f. OOB g. albuterol inhaler 4 times daily h. Daily weight, intake/output i. Lasix 40 mg IV x1 - follow up BMP  2. Type 2  diabetes a. Continue sliding scale and Tradjenta, holding Metformin  3. Dysuria a. UA  4. Leukocytosis may be from UTI, COVID or steroids a. Continue to monitor  5. Hypertension stable  a. Currently holding home lisinopril  6. Morbid obesity  7. Irregular menstrual bleeding a. Continue home letrozole  DVT prophylaxis: Lovenox Family Communication: Called spouse with no response Disposition Plan:  Status is: Inpatient.  Has had minimal ambulation and still requiring nasal cannula.  If she can get up and out of bed and tolerate ambulation then hopeful discharge in next 24 to 48 hours but will likely need supplemental O2  Remains inpatient appropriate because:Inpatient level of care appropriate due to severity of illness   Dispo: The patient is from: Home              Anticipated d/c is to: Home              Anticipated d/c date is: 2 days              Patient currently is not medically stable to d/c.     Pressure injury documentation    None  Consultants  None  Procedures  None  Antibiotics   Anti-infectives (From admission, onward)   Start     Dose/Rate Route Frequency Ordered Stop   08/23/19 1000  remdesivir 100 mg in sodium chloride 0.9 % 100 mL IVPB     100 mg 200 mL/hr over 30 Minutes Intravenous Daily 08/22/19 0311 08/26/19 1110   08/22/19 0330  remdesivir 100 mg in sodium chloride 0.9 %  100 mL IVPB     100 mg 200 mL/hr over 30 Minutes Intravenous Every 30 min 08/22/19 0311 08/22/19 0519        Subjective   Complains of some dysuria and low back pain with urination, similar to previous UTIs.  Urine is also cloudy.  She does not provide much history and only relays one-word answers little as part.  No new events  Objective   Vitals:   08/29/19 2056 08/30/19 0519 08/30/19 0521 08/30/19 1147  BP: 120/76 (!) 102/56    Pulse: 70 (!) 52    Resp: 18 18  13   Temp: 98.5 F (36.9 C) 97.9 F (36.6 C)    TempSrc: Oral Oral    SpO2: 94% 100%  98%   Weight:   127.4 kg   Height:        Intake/Output Summary (Last 24 hours) at 08/30/2019 1318 Last data filed at 08/30/2019 1000 Gross per 24 hour  Intake 720 ml  Output 1300 ml  Net -580 ml   Filed Weights   08/22/19 0014 08/22/19 2035 08/30/19 0521  Weight: 125 kg 125.2 kg 127.4 kg    Examination:  Physical Exam Vitals and nursing note reviewed.  Constitutional:      General: She is not in acute distress.    Appearance: She is obese.  HENT:     Head: Normocephalic and atraumatic.  Eyes:     Conjunctiva/sclera: Conjunctivae normal.  Cardiovascular:     Rate and Rhythm: Normal rate and regular rhythm.  Pulmonary:     Effort: Pulmonary effort is normal.     Breath sounds: Rales present.  Abdominal:     General: Abdomen is flat.     Palpations: Abdomen is soft.     Tenderness: There is no abdominal tenderness.  Musculoskeletal:        General: No swelling or tenderness.  Skin:    Coloration: Skin is not jaundiced or pale.  Neurological:     Mental Status: She is alert. Mental status is at baseline.  Psychiatric:        Behavior: Behavior normal.     Comments: Withdrawn       Data Reviewed: I have personally reviewed following labs and imaging studies  CBC: Recent Labs  Lab 08/24/19 0354 08/25/19 0406 08/26/19 0509 08/27/19 0423 08/30/19 0442  WBC 9.5 12.1* 14.0* 12.5* 14.6*  NEUTROABS 7.7 9.0* 10.3* 8.6*  --   HGB 10.5* 10.2* 10.2* 10.4* 10.6*  HCT 35.9* 35.0* 35.6* 35.3* 36.3  MCV 78.6* 78.5* 79.5* 77.2* 78.2*  PLT 332 363 417* 427* 270*   Basic Metabolic Panel: Recent Labs  Lab 08/24/19 0354 08/25/19 0406 08/26/19 0509 08/27/19 0423 08/29/19 0419  NA 138 142 136 138 135  K 4.5 4.4 4.1 4.3 4.0  CL 103 105 102 102 100  CO2 25 28 25 25 29   GLUCOSE 153* 113* 131* 111* 171*  BUN 17 18 19 20 18   CREATININE 0.83 0.72 0.64 0.86 0.76  CALCIUM 8.7* 9.2 8.9 9.0 9.0   GFR: Estimated Creatinine Clearance: 129.1 mL/min (by C-G formula based on SCr  of 0.76 mg/dL). Liver Function Tests: Recent Labs  Lab 08/24/19 0354 08/25/19 0406 08/26/19 0509 08/27/19 0423  AST 42* 34 29 26  ALT 29 27 27 26   ALKPHOS 32* 32* 30* 37*  BILITOT 0.4 0.2* 0.4 0.3  PROT 7.2 7.0 6.9 6.8  ALBUMIN 3.0* 3.0* 2.9* 3.1*   No results for input(s): LIPASE, AMYLASE in the  last 168 hours. No results for input(s): AMMONIA in the last 168 hours. Coagulation Profile: No results for input(s): INR, PROTIME in the last 168 hours. Cardiac Enzymes: No results for input(s): CKTOTAL, CKMB, CKMBINDEX, TROPONINI in the last 168 hours. BNP (last 3 results) No results for input(s): PROBNP in the last 8760 hours. HbA1C: No results for input(s): HGBA1C in the last 72 hours. CBG: Recent Labs  Lab 08/29/19 1205 08/29/19 1718 08/29/19 2053 08/30/19 0757 08/30/19 1227  GLUCAP 125* 191* 237* 101* 175*   Lipid Profile: No results for input(s): CHOL, HDL, LDLCALC, TRIG, CHOLHDL, LDLDIRECT in the last 72 hours. Thyroid Function Tests: No results for input(s): TSH, T4TOTAL, FREET4, T3FREE, THYROIDAB in the last 72 hours. Anemia Panel: No results for input(s): VITAMINB12, FOLATE, FERRITIN, TIBC, IRON, RETICCTPCT in the last 72 hours. Sepsis Labs: No results for input(s): PROCALCITON, LATICACIDVEN in the last 168 hours.  No results found for this or any previous visit (from the past 240 hour(s)).       Radiology Studies: No results found.      Scheduled Meds: . albuterol  1 puff Inhalation QID  . dexamethasone (DECADRON) injection  6 mg Intravenous Q24H  . enoxaparin (LOVENOX) injection  0.5 mg/kg Subcutaneous Q24H  . insulin aspart  0-9 Units Subcutaneous TID WC  . letrozole  2.5 mg Oral Daily  . linagliptin  5 mg Oral Daily  . pantoprazole  40 mg Oral BID  . polyethylene glycol  17 g Oral Daily  . sodium chloride flush  3 mL Intravenous Q12H   Continuous Infusions:   Time spent: 27 minutes with over 50% of the time coordinating the patient's  care    Jae Dire, DO [facility] Pager [phone]  Call night coverage person covering after 7pm

## 2019-08-31 LAB — CBC
HCT: 36.6 % (ref 36.0–46.0)
Hemoglobin: 10.6 g/dL — ABNORMAL LOW (ref 12.0–15.0)
MCH: 22.5 pg — ABNORMAL LOW (ref 26.0–34.0)
MCHC: 29 g/dL — ABNORMAL LOW (ref 30.0–36.0)
MCV: 77.5 fL — ABNORMAL LOW (ref 80.0–100.0)
Platelets: 519 10*3/uL — ABNORMAL HIGH (ref 150–400)
RBC: 4.72 MIL/uL (ref 3.87–5.11)
RDW: 16.8 % — ABNORMAL HIGH (ref 11.5–15.5)
WBC: 18.1 10*3/uL — ABNORMAL HIGH (ref 4.0–10.5)
nRBC: 0.2 % (ref 0.0–0.2)

## 2019-08-31 LAB — BASIC METABOLIC PANEL
Anion gap: 10 (ref 5–15)
BUN: 23 mg/dL — ABNORMAL HIGH (ref 6–20)
CO2: 25 mmol/L (ref 22–32)
Calcium: 9.2 mg/dL (ref 8.9–10.3)
Chloride: 101 mmol/L (ref 98–111)
Creatinine, Ser: 0.93 mg/dL (ref 0.44–1.00)
GFR calc Af Amer: >60 mL/min (ref >60)
GFR calc non Af Amer: >60 mL/min (ref >60)
Glucose, Bld: 155 mg/dL — ABNORMAL HIGH (ref 70–99)
Potassium: 4.1 mmol/L (ref 3.5–5.1)
Sodium: 136 mmol/L (ref 135–145)

## 2019-08-31 LAB — GLUCOSE, CAPILLARY
Glucose-Capillary: 111 mg/dL — ABNORMAL HIGH (ref 70–99)
Glucose-Capillary: 125 mg/dL — ABNORMAL HIGH (ref 70–99)

## 2019-08-31 MED ORDER — PANTOPRAZOLE SODIUM 40 MG PO TBEC: 40.0000 mg | DELAYED_RELEASE_TABLET | Freq: Every day | ORAL | 0 refills | Status: DC

## 2019-08-31 MED ORDER — BENZONATATE 100 MG PO CAPS: 100.0000 mg | ORAL_CAPSULE | Freq: Four times a day (QID) | ORAL | 0 refills | Status: AC | PRN

## 2019-08-31 MED ORDER — ALBUTEROL SULFATE HFA 108 (90 BASE) MCG/ACT IN AERS: 2.0000 | INHALATION_SPRAY | Freq: Four times a day (QID) | RESPIRATORY_TRACT | 1 refills | Status: DC | PRN

## 2019-08-31 NOTE — Discharge Instructions (Signed)
COVID-19 Frequently Asked Questions COVID-19 (coronavirus disease) is an infection that is caused by a large family of viruses. Some viruses cause illness in people and others cause illness in animals like camels, cats, and bats. In some cases, the viruses that cause illness in animals can spread to humans. Where did the coronavirus come from? In December 2019, [place] told the World Health Organization (WHO) of several cases of lung disease (human respiratory illness). These cases were linked to an open seafood and livestock market in the city of [place]. The link to the seafood and livestock market suggests that the virus may have spread from animals to humans. However, since that first outbreak in December, the virus has also been shown to spread from person to person. What is the name of the disease and the virus? Disease name Early on, this disease was called novel coronavirus. This is because scientists determined that the disease was caused by a new (novel) respiratory virus. The World Health Organization (WHO) has now named the disease COVID-19, or coronavirus disease. Virus name The virus that causes the disease is called severe acute respiratory syndrome coronavirus 2 (SARS-CoV-2). More information on disease and virus naming World Health Organization (WHO): www.who.int/emergencies/diseases/novel-coronavirus-2019/technical-guidance/naming-the-coronavirus-disease-(covid-2019)-and-the-virus-that-causes-it Who is at risk for complications from coronavirus disease? Some people may be at higher risk for complications from coronavirus disease. This includes older adults and people who have chronic diseases, such as heart disease, diabetes, and lung disease. If you are at higher risk for complications, take these extra precautions:  Stay home as much as possible.  Avoid social gatherings and travel.  Avoid close contact with others. Stay at least 6 ft (2 m) away from others, if possible.  Wash  your hands often with soap and water for at least 20 seconds.  Avoid touching your face, mouth, nose, or eyes.  Keep supplies on hand at home, such as food, medicine, and cleaning supplies.  If you must go out in public, wear a cloth face covering or face mask. Make sure your mask covers your nose and mouth. How does coronavirus disease spread? The virus that causes coronavirus disease spreads easily from person to person (is contagious). You may catch the virus by:  Breathing in droplets from an infected person. Droplets can be spread by a person breathing, speaking, singing, coughing, or sneezing.  Touching something, like a table or a doorknob, that was exposed to the virus (contaminated) and then touching your mouth, nose, or eyes. Can I get the virus from touching surfaces or objects? There is still a lot that we do not know about the virus that causes coronavirus disease. Scientists are basing a lot of information on what they know about similar viruses, such as:  Viruses cannot generally survive on surfaces for long. They need a human body (host) to survive.  It is more likely that the virus is spread by close contact with people who are sick (direct contact), such as through: ? Shaking hands or hugging. ? Breathing in respiratory droplets that travel through the air. Droplets can be spread by a person breathing, speaking, singing, coughing, or sneezing.  It is less likely that the virus is spread when a person touches a surface or object that has the virus on it (indirect contact). The virus may be able to enter the body if the person touches a surface or object and then touches his or her face, eyes, nose, or mouth. Can a person spread the virus without having symptoms of the disease?   It may be possible for the virus to spread before a person has symptoms of the disease, but this is most likely not the main way the virus is spreading. It is more likely for the virus to spread by  being in close contact with people who are sick and breathing in the respiratory droplets spread by a person breathing, speaking, singing, coughing, or sneezing. What are the symptoms of coronavirus disease? Symptoms vary from person to person and can range from mild to severe. Symptoms may include:  Fever or chills.  Cough.  Difficulty breathing or feeling short of breath.  Headaches, body aches, or muscle aches.  Runny or stuffy (congested) nose.  Sore throat.  New loss of taste or smell.  Nausea, vomiting, or diarrhea. These symptoms can appear anywhere from 2 to 14 days after you have been exposed to the virus. Some people may not have any symptoms. If you develop symptoms, call your health care provider. People with severe symptoms may need hospital care. Should I be tested for this virus? Your health care provider will decide whether to test you based on your symptoms, history of exposure, and your risk factors. How does a health care provider test for this virus? Health care providers will collect samples to send for testing. Samples may include:  Taking a swab of fluid from the back of your nose and throat, your nose, or your throat.  Taking fluid from the lungs by having you cough up mucus (sputum) into a sterile cup.  Taking a blood sample. Is there a treatment or vaccine for this virus? Currently, there is no vaccine to prevent coronavirus disease. Also, there are no medicines like antibiotics or antivirals to treat the virus. A person who becomes sick is given supportive care, which means rest and fluids. A person may also relieve his or her symptoms by using over-the-counter medicines that treat sneezing, coughing, and runny nose. These are the same medicines that a person takes for the common cold. If you develop symptoms, call your health care provider. People with severe symptoms may need hospital care. What can I do to protect myself and my family from this  virus?     You can protect yourself and your family by taking the same actions that you would take to prevent the spread of other viruses. Take the following actions:  Wash your hands often with soap and water for at least 20 seconds. If soap and water are not available, use alcohol-based hand sanitizer.  Avoid touching your face, mouth, nose, or eyes.  Cough or sneeze into a tissue, sleeve, or elbow. Do not cough or sneeze into your hand or the air. ? If you cough or sneeze into a tissue, throw it away immediately and wash your hands.  Disinfect objects and surfaces that you frequently touch every day.  Stay away from people who are sick.  Avoid going out in public, follow guidance from your state and local health authorities.  Avoid crowded indoor spaces. Stay at least 6 ft (2 m) away from others.  If you must go out in public, wear a cloth face covering or face mask. Make sure your mask covers your nose and mouth.  Stay home if you are sick, except to get medical care. Call your health care provider before you get medical care. Your health care provider will tell you how long to stay home.  Make sure your vaccines are up to date. Ask your health care provider what   vaccines you need. What should I do if I need to travel? Follow travel recommendations from your local health authority, the CDC, and WHO. Travel information and advice  Centers for Disease Control and Prevention (CDC): www.cdc.gov/coronavirus/2019-ncov/travelers/index.html  World Health Organization (WHO): www.who.int/emergencies/diseases/novel-coronavirus-2019/travel-advice Know the risks and take action to protect your health  You are at higher risk of getting coronavirus disease if you are traveling to areas with an outbreak or if you are exposed to travelers from areas with an outbreak.  Wash your hands often and practice good hygiene to lower the risk of catching or spreading the virus. What should I do if I  am sick? General instructions to stop the spread of infection  Wash your hands often with soap and water for at least 20 seconds. If soap and water are not available, use alcohol-based hand sanitizer.  Cough or sneeze into a tissue, sleeve, or elbow. Do not cough or sneeze into your hand or the air.  If you cough or sneeze into a tissue, throw it away immediately and wash your hands.  Stay home unless you must get medical care. Call your health care provider or local health authority before you get medical care.  Avoid public areas. Do not take public transportation, if possible.  If you can, wear a mask if you must go out of the house or if you are in close contact with someone who is not sick. Make sure your mask covers your nose and mouth. Keep your home clean  Disinfect objects and surfaces that are frequently touched every day. This may include: ? Counters and tables. ? Doorknobs and light switches. ? Sinks and faucets. ? Electronics such as phones, remote controls, keyboards, computers, and tablets.  Wash dishes in hot, soapy water or use a dishwasher. Air-dry your dishes.  Wash laundry in hot water. Prevent infecting other household members  Let healthy household members care for children and pets, if possible. If you have to care for children or pets, wash your hands often and wear a mask.  Sleep in a different bedroom or bed, if possible.  Do not share personal items, such as razors, toothbrushes, deodorant, combs, brushes, towels, and washcloths. Where to find more information Centers for Disease Control and Prevention (CDC)  Information and news updates: www.cdc.gov/coronavirus/2019-ncov World Health Organization (WHO)  Information and news updates: www.who.int/emergencies/diseases/novel-coronavirus-2019  Coronavirus health topic: www.who.int/health-topics/coronavirus  Questions and answers on COVID-19: www.who.int/news-room/q-a-detail/q-a-coronaviruses  Global  tracker: who.sprinklr.com American Academy of Pediatrics (AAP)  Information for families: www.healthychildren.org/English/health-issues/conditions/chest-lungs/Pages/2019-Novel-Coronavirus.aspx The coronavirus situation is changing rapidly. Check your local health authority website or the CDC and WHO websites for updates and news. When should I contact a health care provider?  Contact your health care provider if you have symptoms of an infection, such as fever or cough, and you: ? Have been near anyone who is known to have coronavirus disease. ? Have come into contact with a person who is suspected to have coronavirus disease. ? Have traveled to an area where there is an outbreak of COVID-19. When should I get emergency medical care?  Get help right away by calling your local emergency services (911 in the [place] if you have: ? Trouble breathing. ? Pain or pressure in your chest. ? Confusion. ? Blue-tinged lips and fingernails. ? Difficulty waking from sleep. ? Symptoms that get worse. Let the emergency medical personnel know if you think you have coronavirus disease. Summary  A new respiratory virus is spreading from person to person and causing   COVID-19 (coronavirus disease).  The virus that causes COVID-19 appears to spread easily. It spreads from one person to another through droplets from breathing, speaking, singing, coughing, or sneezing.  Older adults and those with chronic diseases are at higher risk of disease. If you are at higher risk for complications, take extra precautions.  There is currently no vaccine to prevent coronavirus disease. There are no medicines, such as antibiotics or antivirals, to treat the virus.  You can protect yourself and your family by washing your hands often, avoiding touching your face, and covering your coughs and sneezes. This information is not intended to replace advice given to you by your health care provider. Make sure you discuss any  questions you have with your health care provider. Document Revised: 02/27/2019 Document Reviewed: 08/26/2018 Elsevier Patient Education  2020 [employer].   COVID-19 COVID-19 is a respiratory infection that is caused by a virus called severe acute respiratory syndrome coronavirus 2 (SARS-CoV-2). The disease is also known as coronavirus disease or novel coronavirus. In some people, the virus may not cause any symptoms. In others, it may cause a serious infection. The infection can get worse quickly and can lead to complications, such as:  Pneumonia, or infection of the lungs.  Acute respiratory distress syndrome or ARDS. This is a condition in which fluid build-up in the lungs prevents the lungs from filling with air and passing oxygen into the blood.  Acute respiratory failure. This is a condition in which there is not enough oxygen passing from the lungs to the body or when carbon dioxide is not passing from the lungs out of the body.  Sepsis or septic shock. This is a serious bodily reaction to an infection.  Blood clotting problems.  Secondary infections due to bacteria or fungus.  Organ failure. This is when your body's organs stop working. The virus that causes COVID-19 is contagious. This means that it can spread from person to person through droplets from coughs and sneezes (respiratory secretions). What are the causes? This illness is caused by a virus. You may catch the virus by:  Breathing in droplets from an infected person. Droplets can be spread by a person breathing, speaking, singing, coughing, or sneezing.  Touching something, like a table or a doorknob, that was exposed to the virus (contaminated) and then touching your mouth, nose, or eyes. What increases the risk? Risk for infection You are more likely to be infected with this virus if you:  Are within 6 feet (2 meters) of a person with COVID-19.  Provide care for or live with a person who is infected with  COVID-19.  Spend time in crowded indoor spaces or live in shared housing. Risk for serious illness You are more likely to become seriously ill from the virus if you:  Are 19 years of age or older. The higher your age, the more you are at risk for serious illness.  Live in a nursing home or long-term care facility.  Have cancer.  Have a long-term (chronic) disease such as: ? Chronic lung disease, including chronic obstructive pulmonary disease or asthma. ? A long-term disease that lowers your body's ability to fight infection (immunocompromised). ? Heart disease, including heart failure, a condition in which the arteries that lead to the heart become narrow or blocked (coronary artery disease), a disease which makes the heart muscle thick, weak, or stiff (cardiomyopathy). ? Diabetes. ? Chronic kidney disease. ? Sickle cell disease, a condition in which red blood cells have  an abnormal "sickle" shape. ? Liver disease.  Are obese. What are the signs or symptoms? Symptoms of this condition can range from mild to severe. Symptoms may appear any time from 2 to 14 days after being exposed to the virus. They include:  A fever or chills.  A cough.  Difficulty breathing.  Headaches, body aches, or muscle aches.  Runny or stuffy (congested) nose.  A sore throat.  New loss of taste or smell. Some people may also have stomach problems, such as nausea, vomiting, or diarrhea. Other people may not have any symptoms of COVID-19. How is this diagnosed? This condition may be diagnosed based on:  Your signs and symptoms, especially if: ? You live in an area with a COVID-19 outbreak. ? You recently traveled to or from an area where the virus is common. ? You provide care for or live with a person who was diagnosed with COVID-19. ? You were exposed to a person who was diagnosed with COVID-19.  A physical exam.  Lab tests, which may include: ? Taking a sample of fluid from the back of  your nose and throat (nasopharyngeal fluid), your nose, or your throat using a swab. ? A sample of mucus from your lungs (sputum). ? Blood tests.  Imaging tests, which may include, X-rays, CT scan, or ultrasound. How is this treated? At present, there is no medicine to treat COVID-19. Medicines that treat other diseases are being used on a trial basis to see if they are effective against COVID-19. Your health care provider will talk with you about ways to treat your symptoms. For most people, the infection is mild and can be managed at home with rest, fluids, and over-the-counter medicines. Treatment for a serious infection usually takes places in a hospital intensive care unit (ICU). It may include one or more of the following treatments. These treatments are given until your symptoms improve.  Receiving fluids and medicines through an IV.  Supplemental oxygen. Extra oxygen is given through a tube in the nose, a face mask, or a hood.  Positioning you to lie on your stomach (prone position). This makes it easier for oxygen to get into the lungs.  Continuous positive airway pressure (CPAP) or bi-level positive airway pressure (BPAP) machine. This treatment uses mild air pressure to keep the airways open. A tube that is connected to a motor delivers oxygen to the body.  Ventilator. This treatment moves air into and out of the lungs by using a tube that is placed in your windpipe.  Tracheostomy. This is a procedure to create a hole in the neck so that a breathing tube can be inserted.  Extracorporeal membrane oxygenation (ECMO). This procedure gives the lungs a chance to recover by taking over the functions of the heart and lungs. It supplies oxygen to the body and removes carbon dioxide. Follow these instructions at home: Lifestyle  If you are sick, stay home except to get medical care. Your health care provider will tell you how long to stay home. Call your health care provider before you go  for medical care.  Rest at home as told by your health care provider.  Do not use any products that contain nicotine or tobacco, such as cigarettes, e-cigarettes, and chewing tobacco. If you need help quitting, ask your health care provider.  Return to your normal activities as told by your health care provider. Ask your health care provider what activities are safe for you. General instructions  Take over-the-counter and  prescription medicines only as told by your health care provider.  Drink enough fluid to keep your urine pale yellow.  Keep all follow-up visits as told by your health care provider. This is important. How is this prevented?  There is no vaccine to help prevent COVID-19 infection. However, there are steps you can take to protect yourself and others from this virus. To protect yourself:   Do not travel to areas where COVID-19 is a risk. The areas where COVID-19 is reported change often. To identify high-risk areas and travel restrictions, check the CDC travel website: FatFares.com.br  If you live in, or must travel to, an area where COVID-19 is a risk, take precautions to avoid infection. ? Stay away from people who are sick. ? Wash your hands often with soap and water for 20 seconds. If soap and water are not available, use an alcohol-based hand sanitizer. ? Avoid touching your mouth, face, eyes, or nose. ? Avoid going out in public, follow guidance from your state and local health authorities. ? If you must go out in public, wear a cloth face covering or face mask. Make sure your mask covers your nose and mouth. ? Avoid crowded indoor spaces. Stay at least 6 feet (2 meters) away from others. ? Disinfect objects and surfaces that are frequently touched every day. This may include:  Counters and tables.  Doorknobs and light switches.  Sinks and faucets.  Electronics, such as phones, remote controls, keyboards, computers, and tablets. To protect  others: If you have symptoms of COVID-19, take steps to prevent the virus from spreading to others.  If you think you have a COVID-19 infection, contact your health care provider right away. Tell your health care team that you think you may have a COVID-19 infection.  Stay home. Leave your house only to seek medical care. Do not use public transport.  Do not travel while you are sick.  Wash your hands often with soap and water for 20 seconds. If soap and water are not available, use alcohol-based hand sanitizer.  Stay away from other members of your household. Let healthy household members care for children and pets, if possible. If you have to care for children or pets, wash your hands often and wear a mask. If possible, stay in your own room, separate from others. Use a different bathroom.  Make sure that all people in your household wash their hands well and often.  Cough or sneeze into a tissue or your sleeve or elbow. Do not cough or sneeze into your hand or into the air.  Wear a cloth face covering or face mask. Make sure your mask covers your nose and mouth. Where to find more information  Centers for Disease Control and Prevention: PurpleGadgets.be  World Health Organization: https://www.castaneda.info/ Contact a health care provider if:  You live in or have traveled to an area where COVID-19 is a risk and you have symptoms of the infection.  You have had contact with someone who has COVID-19 and you have symptoms of the infection. Get help right away if:  You have trouble breathing.  You have pain or pressure in your chest.  You have confusion.  You have bluish lips and fingernails.  You have difficulty waking from sleep.  You have symptoms that get worse. These symptoms may represent a serious problem that is an emergency. Do not wait to see if the symptoms will go away. Get medical help right away. Call your local emergency  services (  911 in the [place] Do not drive yourself to the hospital. Let the emergency medical personnel know if you think you have COVID-19. Summary  COVID-19 is a respiratory infection that is caused by a virus. It is also known as coronavirus disease or novel coronavirus. It can cause serious infections, such as pneumonia, acute respiratory distress syndrome, acute respiratory failure, or sepsis.  The virus that causes COVID-19 is contagious. This means that it can spread from person to person through droplets from breathing, speaking, singing, coughing, or sneezing.  You are more likely to develop a serious illness if you are 73 years of age or older, have a weak immune system, live in a nursing home, or have chronic disease.  There is no medicine to treat COVID-19. Your health care provider will talk with you about ways to treat your symptoms.  Take steps to protect yourself and others from infection. Wash your hands often and disinfect objects and surfaces that are frequently touched every day. Stay away from people who are sick and wear a mask if you are sick. This information is not intended to replace advice given to you by your health care provider. Make sure you discuss any questions you have with your health care provider. Document Revised: 02/27/2019 Document Reviewed: 06/05/2018 Elsevier Patient Education  2020 [employer].

## 2019-08-31 NOTE — Plan of Care (Signed)
  Problem: Health Behavior/Discharge Planning: Goal: Ability to manage health-related needs will improve Outcome: Progressing   

## 2019-08-31 NOTE — Progress Notes (Signed)
D/C instructions reviewed w/ pt. Pt verbalizes understanding and all questions answered. Pt in possession of d/c packet and all personal belongings. States husband can pick her up after work around [employer].

## 2019-08-31 NOTE — TOC Progression Note (Signed)
Transition of Care [facility]) - Progression Note    Patient Details  Name: Colleen Schultz MRN: [id number] Date of Birth: June 29, 1985  Transition [facility]) CM/SW Contact  Geni Bers, RN Phone Number: 08/31/2019, 1:44 PM  Clinical Narrative:    There are no discharge needs at present time.        Expected Discharge Plan and Services           Expected Discharge Date: 08/31/19                                     Social Determinants of Health (SDOH) Interventions    Readmission Risk Interventions No flowsheet data found.

## 2019-08-31 NOTE — Progress Notes (Signed)
Pt O2 sat 97% on RA at rest. Pt ambulated to nurse's station and back w/ steady, independent gait. O2 sat on RA w/ ambulation 93-95%. Pt reports less SOB than with ambulation yesterday.

## 2019-08-31 NOTE — Plan of Care (Signed)
  Problem: Clinical Measurements: Goal: Will remain free from infection 08/31/2019 1232 by Gwendlyn Deutscher, RN Outcome: Adequate for Discharge 08/31/2019 0849 by Gwendlyn Deutscher, RN Outcome: Adequate for Discharge

## 2019-08-31 NOTE — Discharge Summary (Signed)
Physician Discharge Summary  Colleen Schultz JSH:[id number] DOB: June 19, 1985   PCP: [facility]  Admit date: 08/22/2019 Discharge date: 08/31/2019 Length of Stay: 9 days   Code Status: Full Code  Admitted From:  Home Discharged to:   [place]:  None  Equipment/Devices:  None Discharge Condition:  Stable  Recommendations for Outpatient Follow-up   1. Follow up with PCP in 1 week 2. Was normotensive during hospitalization without BP meds, restarted on low dose lisinopril at Dc and held labetolol. BP needs to be reassessed as an outpatient 3. Started on [name]  This is a 34 year old female with history of hypertension, type 2 diabetes, BMI 50, hidradenitis suppurativa presented to the ED with flulike symptoms began around 4/2 and seen in the ED 4/7, diagnosed with COVID-19 at that time but discharged home at that point.  Has had worsening symptoms return to the ED, also with nausea and loose stools.  [facility]Upon arrival to the ED, patient is found to be afebrile, requiring 3 L/min of supplemental oxygen, tachypneic, and with stable blood pressure. EKG features sinus rhythm and chest x-ray with patchy bilateral airspace opacities and consolidations. Chemistry panel with mild elevation in AST. CBC with stable chronic microcytic anemia. D-dimer elevated to 1.27 and CRP elevated to 10.5. Procalcitonin is undetectable. Patient was started on supplemental oxygen and treated with a liter of normal saline, Decadron, remdesivir, fentanyl, acetaminophen, cough syrup, and Zofran.  Patient was transferred to [facility] for further management of Covid.  She has since completed 5 days of remdesivir and 10 days of dexamethasone.  Required supplemental O2 for much of her stay.  Received IV diuresis with Lasix on 4/18 and was able to tolerate room air for 24 hours.  She was able to ambulate without any O2 requirements.  Also  was on standing inhalers as well as twice daily PPI.  Discharged in stable condition with antitussives, albuterol inhaler and Protonix 40 mg daily.  Labetalol was held at discharge and restarted home low-dose lisinopril.  A & P   Principal Problem:   Acute respiratory disease due to COVID-19 virus Active Problems:   Diabetes mellitus without complication ([place])   Hypertension   Anemia   Pneumonia due to COVID-19 virus    1. Acute hypoxic failure secondary to COVID-19 pneumonia a. Tolerating room air at rest and ambulation after Lasix b. Completed 5 days remdesivir in 10 days dexamethasone c. Continue twice daily PPI as patient reports history of GERD  d. Continue albuterol inhaler as needed outpatient as well as daily PPI for suspected GERD  2. Type 2 diabetes a. Restart home Metformin at discharge b. Follow-up with with PCP  3. Dysuria a. UA negative for UTI.  Blood and UA as patient is on her menstrual cycle  4. Steroid-induced leukocytosis  a. Follow-up outpatient  5. Hypertension remained off antihypertensives during hospitalization a. Restart home lisinopril b. Holding labetalol until she follows up with PCP  6. Morbid obesity 1. Outpatient follow-up  7. Irregular menstrual bleeding a. Continue home letrozole    Consultants  . None  Procedures  . None  Antibiotics   Anti-infectives (From admission, onward)   Start     Dose/Rate Route Frequency Ordered Stop   08/23/19 1000  remdesivir 100 mg in sodium chloride 0.9 % 100 mL IVPB     100 mg 200 mL/hr over 30 Minutes Intravenous Daily 08/22/19 0311 08/26/19  1110   08/22/19 0330  remdesivir 100 mg in sodium chloride 0.9 % 100 mL IVPB     100 mg 200 mL/hr over 30 Minutes Intravenous Every 30 min 08/22/19 0311 08/22/19 0519       Subjective  Patient seen and examined at bedside no acute distress and resting comfortably.  No events overnight.  Tolerating diet. In good spirits and anticipating  discharge.   Denies any chest pain, fever, nausea, vomiting, urinary or bowel complaints.  Still with cough but improving.  Otherwise ROS negative    Objective   Discharge Exam: Vitals:   08/30/19 2046 08/31/19 0558  BP: 108/61 109/71  Pulse: 74 74  Resp: (!) 21 20  Temp: 98 F (36.7 C) 98.3 F (36.8 C)  SpO2: 94% 96%   Vitals:   08/30/19 1343 08/30/19 1600 08/30/19 2046 08/31/19 0558  BP: 128/85  108/61 109/71  Pulse: 76  74 74  Resp: 20  (!) 21 20  Temp: 97.9 F (36.6 C)  98 F (36.7 C) 98.3 F (36.8 C)  TempSrc: Oral  Oral Oral  SpO2: 98% 97% 94% 96%  Weight:    130 kg  Height:        Physical Exam Vitals and nursing note reviewed.  Constitutional:      Appearance: Normal appearance.  HENT:     Head: Normocephalic and atraumatic.  Eyes:     Conjunctiva/sclera: Conjunctivae normal.  Cardiovascular:     Rate and Rhythm: Normal rate and regular rhythm.  Pulmonary:     Effort: Pulmonary effort is normal.     Breath sounds: No wheezing.  Abdominal:     General: Abdomen is flat.     Palpations: Abdomen is soft.  Musculoskeletal:        General: No swelling or tenderness.  Skin:    Coloration: Skin is not jaundiced or pale.  Neurological:     Mental Status: She is alert. Mental status is at baseline.  Psychiatric:        Mood and Affect: Mood normal.        Behavior: Behavior normal.       The results of significant diagnostics from this hospitalization (including imaging, microbiology, ancillary and laboratory) are listed below for reference.     Microbiology: No results found for this or any previous visit (from the past 240 hour(s)).   Labs: BNP (last 3 results) Recent Labs    02/22/19 2027  BNP 95.1   Basic Metabolic Panel: Recent Labs  Lab 08/25/19 0406 08/26/19 0509 08/27/19 0423 08/29/19 0419 08/31/19 0411  NA 142 136 138 135 136  K 4.4 4.1 4.3 4.0 4.1  CL 105 102 102 100 101  CO2 _0 GLUCOSE 113* 131* 111* 171*  155*  BUN _1 23*  CREATININE 0.72 0.64 0.86 0.76 0.93  CALCIUM 9.2 8.9 9.0 9.0 9.2   Liver Function Tests: Recent Labs  Lab 08/25/19 0406 08/26/19 0509 08/27/19 0423  AST 34 29 26  ALT _2 ALKPHOS 32* 30* 37*  BILITOT 0.2* 0.4 0.3  PROT 7.0 6.9 6.8  ALBUMIN 3.0* 2.9* 3.1*   No results for input(s): LIPASE, AMYLASE in the last 168 hours. No results for input(s): AMMONIA in the last 168 hours. CBC: Recent Labs  Lab 08/25/19 0406 08/26/19 0509 08/27/19 0423 08/30/19 0442 08/31/19 0411  WBC 12.1* 14.0* 12.5* 14.6* 18.1*  NEUTROABS 9.0* 10.3* 8.6*  --   --  HGB 10.2* 10.2* 10.4* 10.6* 10.6*  HCT 35.0* 35.6* 35.3* 36.3 36.6  MCV 78.5* 79.5* 77.2* 78.2* 77.5*  PLT 363 417* 427* 509* 519*   Cardiac Enzymes: No results for input(s): CKTOTAL, CKMB, CKMBINDEX, TROPONINI in the last 168 hours. BNP: Invalid input(s): POCBNP CBG: Recent Labs  Lab 08/30/19 0757 08/30/19 1227 08/30/19 1626 08/30/19 2119 08/31/19 0800  GLUCAP 101* 175* 183* 192* 111*   D-Dimer Recent Labs    08/30/19 0442  DDIMER 0.82*   Hgb A1c No results for input(s): HGBA1C in the last 72 hours. Lipid Profile No results for input(s): CHOL, HDL, LDLCALC, TRIG, CHOLHDL, LDLDIRECT in the last 72 hours. Thyroid function studies No results for input(s): TSH, T4TOTAL, T3FREE, THYROIDAB in the last 72 hours.  Invalid input(s): FREET3 Anemia work up No results for input(s): VITAMINB12, FOLATE, FERRITIN, TIBC, IRON, RETICCTPCT in the last 72 hours. Urinalysis    Component Value Date/Time   COLORURINE YELLOW 08/30/2019 0943   APPEARANCEUR HAZY (A) 08/30/2019 0943   LABSPEC 1.013 08/30/2019 0943   PHURINE 6.0 08/30/2019 0943   GLUCOSEU NEGATIVE 08/30/2019 0943   HGBUR LARGE (A) 08/30/2019 0943   BILIRUBINUR NEGATIVE 08/30/2019 0943   KETONESUR NEGATIVE 08/30/2019 0943   PROTEINUR NEGATIVE 08/30/2019 0943   UROBILINOGEN 1.0 01/04/2014 1425   NITRITE NEGATIVE 08/30/2019 0943    LEUKOCYTESUR TRACE (A) 08/30/2019 0943   Sepsis Labs Invalid input(s): PROCALCITONIN,  WBC,  LACTICIDVEN Microbiology No results found for this or any previous visit (from the past 240 hour(s)).  Discharge Instructions     Discharge Instructions    Diet - low sodium heart healthy   Complete by: As directed    Discharge instructions   Complete by: As directed    You were seen in the hospital for COVID-19.  Upon discharge: -Take Protonix once daily 30 minutes before food -You can stop taking the labetalol for blood pressure right now and restart lisinopril.  You will need to follow-up with your primary care physician to reassess your high blood pressure -Use the albuterol inhaler every 6 hours as needed for wheezing or shortness of breath -You are still contagious with COVID-19 and should self isolate at home.  Isolation can be discontinued when the following criteria are met:  At least 10 days have passed since symptoms first appeared AND You are at least 1 day (24 hours) since resolution of fever without the use of any fever reducing medications (Tylenol, Motrin, ibuprofen, etc.) AND There is improvement in your symptoms (ex. shortness of breath, cough, etc.)  If you have any questions do not hesitate to contact your primary care physician or return to the ED if worsening symptoms.   Increase activity slowly   Complete by: As directed      Allergies as of 08/31/2019      Reactions   Adhesive [tape] Itching   Cinnamon Hives      Medication List    STOP taking these medications   chlorhexidine 4 % external liquid Commonly known as: Hibiclens   doxycycline 100 MG capsule Commonly known as: VIBRAMYCIN   labetalol 100 MG tablet Commonly known as: NORMODYNE   meloxicam 15 MG tablet Commonly known as: MOBIC   predniSONE 5 MG tablet Commonly known as: DELTASONE   promethazine 12.5 MG tablet Commonly known as: PHENERGAN     TAKE these medications   albuterol 108  (90 Base) MCG/ACT inhaler Commonly known as: VENTOLIN HFA Inhale 2 puffs into the lungs every 6 (six) hours  as needed for wheezing or shortness of breath. What changed: reasons to take this   benzonatate 100 MG capsule Commonly known as: Tessalon Perles Take 1 capsule (100 mg total) by mouth every 6 (six) hours as needed for cough.   Fusion Plus Caps Take 1 capsule by mouth in the morning, at noon, and at bedtime. Take 1 capsule by mouth three times daily   letrozole 2.5 MG tablet Commonly known as: FEMARA Take 2.5 mg by mouth See admin instructions. Take 1 tablet (2.57m) daily on days 5,6,7,8,9 of cycle   lisinopril 2.5 MG tablet Commonly known as: ZESTRIL Take 2.5 mg by mouth daily.   medroxyPROGESTERone 10 MG tablet Commonly known as: PROVERA Take 20 mg by mouth See admin instructions. Take 2 tablets (246m by mouth every morning for 5 days to start period   metFORMIN 500 MG 24 hr tablet Commonly known as: GLUCOPHAGE-XR Take 500 mg by mouth 2 (two) times daily.   pantoprazole 40 MG tablet Commonly known as: PROTONIX Take 1 tablet (40 mg total) by mouth daily.   Prenatal 27-1 MG Tabs Take 1 tablet by mouth daily.   Vitamin D (Ergocalciferol) 1.25 MG (50000 UNIT) Caps capsule Commonly known as: DRISDOL Take 50,000 Units by mouth once a week.       Allergies  Allergen Reactions  . Adhesive [Tape] Itching  . Cinnamon Hives   Dispo: The patient is from: Home              Anticipated d/c is to: Home              Anticipated d/c date is: today              Patient currently is medically stable to d/c.        Time coordinating discharge: Over 30 minutes   SIGNED:   JaHarold HedgeD.O. [facility] Pager: [phone]/19/2021, 11:21 AM

## 2019-11-17 ENCOUNTER — Emergency Department (HOSPITAL_BASED_OUTPATIENT_CLINIC_OR_DEPARTMENT_OTHER): Payer: Medicaid Other

## 2019-11-17 ENCOUNTER — Encounter (HOSPITAL_BASED_OUTPATIENT_CLINIC_OR_DEPARTMENT_OTHER): Payer: Self-pay

## 2019-11-17 ENCOUNTER — Emergency Department (HOSPITAL_BASED_OUTPATIENT_CLINIC_OR_DEPARTMENT_OTHER)
Admission: EM | Admit: 2019-11-17 | Discharge: 2019-11-17 | Disposition: A | Discharge status: Home / Self Care | Payer: Medicaid Other | Attending: Emergency Medicine | Admitting: Emergency Medicine

## 2019-11-17 ENCOUNTER — Other Ambulatory Visit: Payer: Self-pay

## 2019-11-17 DIAGNOSIS — R0989 Other specified symptoms and signs involving the circulatory and respiratory systems: Secondary | ICD-10-CM | POA: Insufficient documentation

## 2019-11-17 DIAGNOSIS — E119 Type 2 diabetes mellitus without complications: Secondary | ICD-10-CM | POA: Insufficient documentation

## 2019-11-17 DIAGNOSIS — I10 Essential (primary) hypertension: Secondary | ICD-10-CM | POA: Insufficient documentation

## 2019-11-17 DIAGNOSIS — R5383 Other fatigue: Secondary | ICD-10-CM | POA: Diagnosis not present

## 2019-11-17 DIAGNOSIS — Z7984 Long term (current) use of oral hypoglycemic drugs: Secondary | ICD-10-CM | POA: Insufficient documentation

## 2019-11-17 DIAGNOSIS — R1310 Dysphagia, unspecified: Secondary | ICD-10-CM | POA: Diagnosis present

## 2019-11-17 DIAGNOSIS — Z79899 Other long term (current) drug therapy: Secondary | ICD-10-CM | POA: Diagnosis not present

## 2019-11-17 LAB — COMPREHENSIVE METABOLIC PANEL
ALT: 12 U/L (ref 0–44)
AST: 17 U/L (ref 15–41)
Albumin: 3.9 g/dL (ref 3.5–5.0)
Alkaline Phosphatase: 43 U/L (ref 38–126)
Anion gap: 9 (ref 5–15)
BUN: 8 mg/dL (ref 6–20)
CO2: 22 mmol/L (ref 22–32)
Calcium: 8.9 mg/dL (ref 8.9–10.3)
Chloride: 104 mmol/L (ref 98–111)
Creatinine, Ser: 0.9 mg/dL (ref 0.44–1.00)
GFR calc Af Amer: >60 mL/min (ref >60)
GFR calc non Af Amer: >60 mL/min (ref >60)
Glucose, Bld: 99 mg/dL (ref 70–99)
Potassium: 3.8 mmol/L (ref 3.5–5.1)
Sodium: 135 mmol/L (ref 135–145)
Total Bilirubin: 0.6 mg/dL (ref 0.3–1.2)
Total Protein: 8 g/dL (ref 6.5–8.1)

## 2019-11-17 LAB — CBC WITH DIFFERENTIAL/PLATELET
Abs Immature Granulocytes: 0.05 10*3/uL (ref 0.00–0.07)
Basophils Absolute: 0.1 10*3/uL (ref 0.0–0.1)
Basophils Relative: 1 %
Eosinophils Absolute: 0.1 10*3/uL (ref 0.0–0.5)
Eosinophils Relative: 1 %
HCT: 26.3 % — ABNORMAL LOW (ref 36.0–46.0)
Hemoglobin: 7.3 g/dL — ABNORMAL LOW (ref 12.0–15.0)
Immature Granulocytes: 1 %
Lymphocytes Relative: 28 %
Lymphs Abs: 2.2 10*3/uL (ref 0.7–4.0)
MCH: 19.4 pg — ABNORMAL LOW (ref 26.0–34.0)
MCHC: 27.8 g/dL — ABNORMAL LOW (ref 30.0–36.0)
MCV: 69.9 fL — ABNORMAL LOW (ref 80.0–100.0)
Monocytes Absolute: 0.3 10*3/uL (ref 0.1–1.0)
Monocytes Relative: 4 %
Neutro Abs: 5.2 10*3/uL (ref 1.7–7.7)
Neutrophils Relative %: 65 %
Platelets: 306 10*3/uL (ref 150–400)
RBC: 3.76 MIL/uL — ABNORMAL LOW (ref 3.87–5.11)
RDW: 17.3 % — ABNORMAL HIGH (ref 11.5–15.5)
WBC: 7.9 10*3/uL (ref 4.0–10.5)
nRBC: 0 % (ref 0.0–0.2)

## 2019-11-17 LAB — PREGNANCY, URINE: Preg Test, Ur: NEGATIVE

## 2019-11-17 LAB — D-DIMER, QUANTITATIVE: D-Dimer, Quant: 1.23 ug/mL-FEU — ABNORMAL HIGH (ref 0.00–0.50)

## 2019-11-17 MED ORDER — SODIUM CHLORIDE 0.9 % IV BOLUS
500.0000 mL | Freq: Once | INTRAVENOUS | Status: AC
  Administered 2019-11-17: 500 mL via INTRAVENOUS

## 2019-11-17 MED ORDER — IOHEXOL 350 MG/ML SOLN
100.0000 mL | Freq: Once | INTRAVENOUS | Status: AC
  Administered 2019-11-17: 100 mL via INTRAVENOUS

## 2019-11-17 MED ORDER — LIDOCAINE VISCOUS HCL 2 % MT SOLN
15.0000 mL | Freq: Once | OROMUCOSAL | Status: AC
  Administered 2019-11-17: 15 mL via ORAL
  Filled 2019-11-17: qty 15

## 2019-11-17 MED ORDER — ALUM & MAG HYDROXIDE-SIMETH 200-200-20 MG/5ML PO SUSP
30.0000 mL | Freq: Once | ORAL | Status: AC
  Administered 2019-11-17: 30 mL via ORAL
  Filled 2019-11-17: qty 30

## 2019-11-17 NOTE — ED Notes (Signed)
Patient transported to X-ray 

## 2019-11-17 NOTE — ED Provider Notes (Signed)
[facility] EMERGENCY DEPARTMENT Provider Note   CSN: [id number] Arrival date & time: 11/17/19  1305     History Chief Complaint  Patient presents with  . Dysphagia    Colleen Schultz is a 34 y.o. female.  HPI      Colleen Schultz is a 34 y.o. female, with a history of iron deficiency anemia, DM, HTN, obesity, thyroid disease, PE, presenting to the ED with fatigue for the last month. She has had some shortness of breath with exertion as well.  The symptoms have been present for the past month without worsening.  She states symptoms are consistent with her previous iron deficiency anemia.  She has been taking iron supplements and voices compliance.  She states she was last seen by her PCP in early June 2021 and her hemoglobin was 7 at that time.  She was told to continue her iron supplementation regimen. Denies chest pain, dizziness, syncope, abdominal pain, hematochezia/melena. She states her menstrual cycles are quite heavy and last for 2 or 3 weeks at a time.  She is working with her PCP on this matter.  LMP was at the beginning of June.  She also complains of a foreign body sensation in the throat for the last 2 weeks.  She cannot pinpoint the onset of the sensation with a certain event or meal. She does not have discomfort in the throat or chest when she is not swallowing.  It does not seem to be positional.  She is able to eat and drink, though these worsen her discomfort. She noted some gagging yesterday, but no spontaneous vomiting. Interestingly, patient states she has had this sensation in the past.  7 years ago when she was pregnant she had a similar sensation and ended up having a PE.  She has no longer on anticoagulation.  Denies current shortness of breath, fever/chills, lower extremity edema/pain, or any other complaints.    Past Medical History:  Diagnosis Date  . Anemia   . Blood transfusion without reported diagnosis   . Diabetes mellitus without  complication (HCC)   . Hypertension   . Obesity   . Thyroid disease     Patient Active Problem List   Diagnosis Date Noted  . Acute respiratory disease due to COVID-19 virus 08/22/2019  . Diabetes mellitus without complication (HCC)   . Hypertension   . Anemia   . Pneumonia due to COVID-19 virus   . Acute pain of right shoulder 02/05/2019    Past Surgical History:  Procedure Laterality Date  . CARPAL TUNNEL RELEASE    . CESAREAN SECTION    . LEEP    . THYROID SURGERY       OB History   No obstetric history on file.     History reviewed. No pertinent family history.  Social History   Tobacco Use  . Smoking status: Never Smoker  . Smokeless tobacco: Never Used  Vaping Use  . Vaping Use: Never used  Substance Use Topics  . Alcohol use: Yes    Comment: socially  . Drug use: No    Home Medications Prior to Admission medications   Medication Sig Start Date End Date Taking? Authorizing Provider  albuterol (VENTOLIN HFA) 108 (90 Base) MCG/ACT inhaler Inhale 2 puffs into the lungs every 6 (six) hours as needed for wheezing or shortness of breath. 08/31/19   Jae DireSegal, Jared E, MD  benzonatate (TESSALON PERLES) 100 MG capsule Take 1 capsule (100 mg total) by mouth  every 6 (six) hours as needed for cough. 08/31/19 08/30/20  Jae Dire, MD  Iron-FA-B Cmp-C-Biot-Probiotic (FUSION PLUS) CAPS Take 1 capsule by mouth in the morning, at noon, and at bedtime. Take 1 capsule by mouth three times daily    [provider]  letrozole (FEMARA) 2.5 MG tablet Take 2.5 mg by mouth See admin instructions. Take 1 tablet (2.5mg ) daily on days 5,6,7,8,9 of cycle 07/24/19   [provider]  lisinopril (PRINIVIL,ZESTRIL) 2.5 MG tablet Take 2.5 mg by mouth daily.    [provider]  medroxyPROGESTERone (PROVERA) 10 MG tablet Take 20 mg by mouth See admin instructions. Take 2 tablets (20mg ) by mouth every morning for 5 days to start period 07/24/19   [provider]    metFORMIN (GLUCOPHAGE-XR) 500 MG 24 hr tablet Take 500 mg by mouth 2 (two) times daily. 05/07/19   [provider]  pantoprazole (PROTONIX) 40 MG tablet Take 1 tablet (40 mg total) by mouth daily. 08/31/19 09/30/19  10/02/19, MD  Prenatal 27-1 MG TABS Take 1 tablet by mouth daily. 03/12/19   [provider]  Vitamin D, Ergocalciferol, (DRISDOL) 1.25 MG (50000 UNIT) CAPS capsule Take 50,000 Units by mouth once a week. 05/07/19   [provider]    Allergies    Adhesive [tape] and Cinnamon  Review of Systems   Review of Systems  Constitutional: Positive for fatigue. Negative for appetite change, chills, diaphoresis and fever.  HENT: Negative for drooling, trouble swallowing and voice change.        Foreign body sensation in the throat  Respiratory: Negative for cough, choking, shortness of breath and stridor.   Cardiovascular: Negative for chest pain and leg swelling.  Gastrointestinal: Negative for abdominal pain, blood in stool, constipation, diarrhea, nausea and vomiting.  Musculoskeletal: Negative for back pain, neck pain and neck stiffness.  Neurological: Negative for dizziness, syncope, weakness and headaches.  All other systems reviewed and are negative.   Physical Exam Updated Vital Signs BP (!) 94/51 (BP Location: Right Arm)   Pulse 83   Temp 98.4 F (36.9 C) (Oral)   Resp 16   Ht 5\' 2"  (1.575 m)   Wt 128.8 kg   LMP 10/13/2019   SpO2 100%   BMI 51.94 kg/m   Physical Exam Vitals and nursing note reviewed.  Constitutional:      General: She is not in acute distress.    Appearance: She is well-developed. She is not diaphoretic.     Comments: Patient appears relaxed.  She is sitting in a semireclined position in no apparent distress.  HENT:     Head: Normocephalic and atraumatic.     Mouth/Throat:     Mouth: Mucous membranes are moist.     Pharynx: Oropharynx is clear.     Comments: No noted area of intraoral swelling or fluctuance.   No trismus or noted abnormal phonation.  Mouth opening to at least 3 finger widths.  Handles oral secretions without difficulty.  No noted facial swelling.  No sublingual swelling or tongue elevation.  No swelling or tenderness to the submental or submandibular regions.  Eyes:     Conjunctiva/sclera: Conjunctivae normal.  Neck:      Comments: Full range of motion without noted difficulty in the neck. Tenderness in the regions indicated without swelling, fullness, fluctuance, or color abnormality. Cardiovascular:     Rate and Rhythm: Normal rate and regular rhythm.     Pulses: Normal pulses.  Radial pulses are 2+ on the right side and 2+ on the left side.     Heart sounds: Normal heart sounds.     Comments: Tactile temperature in the extremities appropriate and equal bilaterally. Pulmonary:     Effort: Pulmonary effort is normal. No respiratory distress.     Breath sounds: Normal breath sounds.  Chest:     Chest wall: Tenderness present. No mass, deformity, swelling or crepitus.    Abdominal:     Palpations: Abdomen is soft.     Tenderness: There is no abdominal tenderness. There is no guarding.  Musculoskeletal:     Cervical back: Normal range of motion and neck supple.  Lymphadenopathy:     Cervical: No cervical adenopathy.  Skin:    General: Skin is warm and dry.  Neurological:     Mental Status: She is alert.  Psychiatric:        Mood and Affect: Mood and affect normal.        Speech: Speech normal.        Behavior: Behavior normal.     ED Results / Procedures / Treatments   Labs (all labs ordered are listed, but only abnormal results are displayed) Labs Reviewed  CBC WITH DIFFERENTIAL/PLATELET - Abnormal; Notable for the following components:      Result Value   RBC 3.76 (*)    Hemoglobin 7.3 (*)    HCT 26.3 (*)    MCV 69.9 (*)    MCH 19.4 (*)    MCHC 27.8 (*)    RDW 17.3 (*)    All other components within normal limits  D-DIMER, QUANTITATIVE (NOT AT  [facility]) - Abnormal; Notable for the following components:   D-Dimer, Quant 1.23 (*)    All other components within normal limits  COMPREHENSIVE METABOLIC PANEL  PREGNANCY, URINE      EKG EKG Interpretation  Date/Time:  Tuesday November 17 2019 15:32:27 EDT Ventricular Rate:  53 PR Interval:    QRS Duration: 88 QT Interval:  419 QTC Calculation: 394 R Axis:   54 Text Interpretation: Sinus rhythm Baseline wander in lead(s) II III aVF Confirmed by Vanetta Mulders [phone]) on 11/17/2019 3:38:16 PM   Radiology DG Neck Soft Tissue  Result Date: 11/17/2019 CLINICAL DATA:  Sensation of foreign body with vomiting for 2 weeks. Dysphagia. EXAM: NECK SOFT TISSUES - 1+ VIEW COMPARISON:  None. FINDINGS: Frontal and lateral views obtained. No evident radiopaque foreign body. Epiglottis and aryepiglottic folds appear normal. Prevertebral soft tissues are normal. No air-fluid level to suggest abscess. Tongue and tongue base regions appear normal. Visualized trachea appears normal. No bone lesions. IMPRESSION: No abnormality appreciable. Electronically Signed   By: Bretta Bang III M.D.   On: 11/17/2019 16:17   DG Chest 2 View  Result Date: 11/17/2019 CLINICAL DATA:  Dysphagia EXAM: CHEST - 2 VIEW COMPARISON:  August 22, 2019 FINDINGS: Lungs are clear. Heart is upper normal in size with pulmonary vascularity normal. No adenopathy. No evident pneumothorax or pneumomediastinum. Visualized trachea appears normal. No bone lesions. IMPRESSION: Lungs clear. Heart upper normal in size. No pneumothorax or pneumomediastinum appreciable. Electronically Signed   By: Bretta Bang III M.D.   On: 11/17/2019 16:17   CT Soft Tissue Neck W Contrast  Result Date: 11/17/2019 CLINICAL DATA:  Sensation of something stuck in the throat over the last 2 weeks. Vomiting. Epiglottitis or tonsillitis suspected. EXAM: CT NECK WITH CONTRAST TECHNIQUE: Multidetector CT imaging of the neck was performed using the standard protocol  following the bolus administration of intravenous contrast. CONTRAST:  OMNIPAQUE IOHEXOL 350 MG/ML SOLN COMPARISON:  Radiography same day FINDINGS: Pharynx and larynx: No mucosal or submucosal lesion seen. CT does not suggest gross tonsillitis. No tonsillar or peritonsillar abscess. Epiglottis is normal. Airway appears patent and normal. Salivary glands: Parotid and submandibular glands are normal. Thyroid: Apparent previous left thyroidectomy. Lymph nodes: No enlarged or low-density nodes on either side of the neck. Vascular: No abnormal vascular finding. Limited intracranial: Normal Visualized orbits: Normal Mastoids and visualized paranasal sinuses: Clear Skeleton: Normal Upper chest: Normal Other: None IMPRESSION: No abnormality seen to explain the clinical presentation. No CT evidence of tonsillitis or epiglottitis. No evidence of inflammatory disease or mass lesion otherwise in the neck. Previous left thyroidectomy. Electronically Signed   By: Paulina Fusi M.D.   On: 11/17/2019 17:36   CT Angio Chest PE W and/or Wo Contrast  Result Date: 11/17/2019 CLINICAL DATA:  PE suspected, low/intermediate prob, positive D-dimer Patient reports foreign body sensation in throat for 2 weeks. Vomiting. EXAM: CT ANGIOGRAPHY CHEST WITH CONTRAST TECHNIQUE: Multidetector CT imaging of the chest was performed using the standard protocol during bolus administration of intravenous contrast. Multiplanar CT image reconstructions and MIPs were obtained to evaluate the vascular anatomy. CONTRAST:  OMNIPAQUE IOHEXOL 350 MG/ML SOLN COMPARISON:  Chest radiograph earlier this day. FINDINGS: Cardiovascular: There are no filling defects within the pulmonary arteries to suggest pulmonary embolus. Evaluation is diagnostic to the segmental level. The subsegmental branches cannot be assessed due to soft tissue attenuation from habitus. Heart is normal in size. No pericardial effusion. No aortic dissection. Mediastinum/Nodes: No  enlarged mediastinal or hilar lymph nodes. Suspected left thyroidectomy. No visualized right thyroid nodule. There is no pneumomediastinum or significant esophageal wall thickening. No paraesophageal stranding. No radiopaque foreign body Lungs/Pleura: Minor hypoventilatory changes in the dependent lungs. Subsegmental lower lobe atelectasis. No confluent airspace disease. No pulmonary edema. The trachea and mainstem bronchi are patent. No intraluminal debris. No pulmonary mass. Upper Abdomen: No evidence of acute finding. Musculoskeletal: There are no acute or suspicious osseous abnormalities. Review of the MIP images confirms the above findings. IMPRESSION: 1. No pulmonary embolus or acute intrathoracic abnormality. 2. No pneumomediastinum or explanation for foreign body sensation. Electronically Signed   By: Narda Rutherford M.D.   On: 11/17/2019 17:35    Procedures Procedures (including critical care time)  Medications Ordered in ED Medications  sodium chloride 0.9 % bolus 500 mL ( Intravenous Stopped 11/17/19 1809)  alum & mag hydroxide-simeth (MAALOX/MYLANTA) 200-200-20 MG/5ML suspension 30 mL (30 mLs Oral Given 11/17/19 1542)    And  lidocaine (XYLOCAINE) 2 % viscous mouth solution 15 mL (15 mLs Oral Given 11/17/19 1542)  iohexol (OMNIPAQUE) 350 MG/ML injection 100 mL (100 mLs Intravenous Contrast Given 11/17/19 1706)    ED Course  I have reviewed the triage vital signs and the nursing notes.  Pertinent labs & imaging results that were available during my care of the patient were reviewed by me and considered in my medical decision making (see chart for details).  Clinical Course as of Nov 16 1849  Tue Nov 17, 2019  1516 Remeasuring her blood pressure noted to be 123/70.  BP(!): 94/51 [SJ]  1633 Patient states her HGB was 7 at the beginning of June at her PCP office.   Hemoglobin(!): 7.3 [SJ]  1825 Discussed imaging results with the patient.  I had the patient drink water while I was in the  room.  She was  able to readily drink water and kept it down.   [SJ]    Clinical Course User Index [SJ] Toniette Devera, Hillard Danker, PA-C   MDM Rules/Calculators/A&P                          Patient presents with a couple complaints. Patient is nontoxic appearing, afebrile, not tachycardic, not tachypneic, not hypotensive, maintains excellent SPO2 on room air, and is in no apparent distress.   I have reviewed the patient's chart to obtain more information.   I reviewed and interpreted the patient's labs and radiological studies. Her hemoglobin was 7.3, consistent with her reported level of 7 at the beginning of June.  Her presentation and vital signs are stable, therefore we will have the patient follow-up with her PCP on this matter. D-dimer elevated and therefore CT PE study was ordered. Her other lab work reassuring.  No acute abnormalities on the patient's CT of the neck or her PE study. Tolerating oral fluids at time of discharge. She will follow-up with ENT versus GI.  Patient has seen GI through [facility] as recently as last year.  The patient was given instructions for home care as well as return precautions. Patient voices understanding of these instructions, accepts the plan, and is comfortable with discharge.   Findings and plan of care discussed with Vanetta Mulders, MD.   Vitals:   11/17/19 1537 11/17/19 1615 11/17/19 1700 11/17/19 1809  BP: 123/70 136/80 122/71 137/76  Pulse: 65 70 74 74  Resp: Temp:      TempSrc:      SpO2: 100% 100% 100% 100%  Weight:      Height:         Final Clinical Impression(s) / ED Diagnoses Final diagnoses:  Foreign body sensation in throat  Fatigue, unspecified type    Rx / DC Orders ED Discharge Orders    None       Concepcion Living 11/17/19 1852    Vanetta Mulders, MD 11/18/19 1616

## 2019-11-17 NOTE — ED Triage Notes (Signed)
Pt c/o feeling that she has something stuck in her throat X2 weeks. Pt reports she is vomiting everything that she eats r/t trouble swallowing. NAD, airway intact. Pt states that she has also been feeling tired.

## 2019-11-17 NOTE — Discharge Instructions (Addendum)
May take Tylenol for discomfort. Be sure to stay well-hydrated by drinking plenty fluids. Follow-up with your primary care provider regarding your anemia. Follow-up with gastroenterology or ear nose and throat (ENT) for further evaluation of the foreign body sensation.

## 2020-04-01 ENCOUNTER — Other Ambulatory Visit: Payer: Self-pay

## 2020-04-01 ENCOUNTER — Encounter (HOSPITAL_BASED_OUTPATIENT_CLINIC_OR_DEPARTMENT_OTHER): Payer: Self-pay | Admitting: *Deleted

## 2020-04-01 ENCOUNTER — Emergency Department (HOSPITAL_BASED_OUTPATIENT_CLINIC_OR_DEPARTMENT_OTHER)
Admission: EM | Admit: 2020-04-01 | Discharge: 2020-04-01 | Disposition: A | Discharge status: Home / Self Care | Payer: Medicaid Other | Attending: Emergency Medicine | Admitting: Emergency Medicine

## 2020-04-01 DIAGNOSIS — I10 Essential (primary) hypertension: Secondary | ICD-10-CM | POA: Diagnosis not present

## 2020-04-01 DIAGNOSIS — Z8616 Personal history of COVID-19: Secondary | ICD-10-CM | POA: Diagnosis not present

## 2020-04-01 DIAGNOSIS — Z79899 Other long term (current) drug therapy: Secondary | ICD-10-CM | POA: Insufficient documentation

## 2020-04-01 DIAGNOSIS — D62 Acute posthemorrhagic anemia: Secondary | ICD-10-CM | POA: Diagnosis not present

## 2020-04-01 DIAGNOSIS — E119 Type 2 diabetes mellitus without complications: Secondary | ICD-10-CM | POA: Diagnosis not present

## 2020-04-01 DIAGNOSIS — N920 Excessive and frequent menstruation with regular cycle: Secondary | ICD-10-CM | POA: Diagnosis not present

## 2020-04-01 DIAGNOSIS — Z7984 Long term (current) use of oral hypoglycemic drugs: Secondary | ICD-10-CM | POA: Insufficient documentation

## 2020-04-01 DIAGNOSIS — N946 Dysmenorrhea, unspecified: Secondary | ICD-10-CM

## 2020-04-01 DIAGNOSIS — N939 Abnormal uterine and vaginal bleeding, unspecified: Secondary | ICD-10-CM | POA: Diagnosis present

## 2020-04-01 DIAGNOSIS — D500 Iron deficiency anemia secondary to blood loss (chronic): Secondary | ICD-10-CM

## 2020-04-01 LAB — CBC WITH DIFFERENTIAL/PLATELET
Abs Immature Granulocytes: 0.07 10*3/uL (ref 0.00–0.07)
Basophils Absolute: 0.1 10*3/uL (ref 0.0–0.1)
Basophils Relative: 1 %
Eosinophils Absolute: 0.1 10*3/uL (ref 0.0–0.5)
Eosinophils Relative: 1 %
HCT: 23.1 % — ABNORMAL LOW (ref 36.0–46.0)
Hemoglobin: 6.5 g/dL — CL (ref 12.0–15.0)
Immature Granulocytes: 1 %
Lymphocytes Relative: 24 %
Lymphs Abs: 2.2 10*3/uL (ref 0.7–4.0)
MCH: 18.6 pg — ABNORMAL LOW (ref 26.0–34.0)
MCHC: 28.1 g/dL — ABNORMAL LOW (ref 30.0–36.0)
MCV: 66.2 fL — ABNORMAL LOW (ref 80.0–100.0)
Monocytes Absolute: 0.4 10*3/uL (ref 0.1–1.0)
Monocytes Relative: 4 %
Neutro Abs: 6.4 10*3/uL (ref 1.7–7.7)
Neutrophils Relative %: 69 %
Platelets: 383 10*3/uL (ref 150–400)
RBC: 3.49 MIL/uL — ABNORMAL LOW (ref 3.87–5.11)
RDW: 19.9 % — ABNORMAL HIGH (ref 11.5–15.5)
Smear Review: NORMAL
WBC: 9.1 10*3/uL (ref 4.0–10.5)
nRBC: 0 % (ref 0.0–0.2)

## 2020-04-01 LAB — COMPREHENSIVE METABOLIC PANEL
ALT: 11 U/L (ref 0–44)
AST: 17 U/L (ref 15–41)
Albumin: 3.9 g/dL (ref 3.5–5.0)
Alkaline Phosphatase: 40 U/L (ref 38–126)
Anion gap: 9 (ref 5–15)
BUN: 9 mg/dL (ref 6–20)
CO2: 23 mmol/L (ref 22–32)
Calcium: 9 mg/dL (ref 8.9–10.3)
Chloride: 106 mmol/L (ref 98–111)
Creatinine, Ser: 0.94 mg/dL (ref 0.44–1.00)
GFR, Estimated: >60 mL/min (ref >60)
Glucose, Bld: 127 mg/dL — ABNORMAL HIGH (ref 70–99)
Potassium: 3.6 mmol/L (ref 3.5–5.1)
Sodium: 138 mmol/L (ref 135–145)
Total Bilirubin: 0.2 mg/dL — ABNORMAL LOW (ref 0.3–1.2)
Total Protein: 7.5 g/dL (ref 6.5–8.1)

## 2020-04-01 LAB — PREGNANCY, URINE: Preg Test, Ur: NEGATIVE

## 2020-04-01 NOTE — ED Provider Notes (Signed)
[facility] EMERGENCY DEPARTMENT Provider Note   CSN: [id number] Arrival date & time: 04/01/20  1248     History Chief Complaint  Patient presents with  . Vaginal Bleeding    Colleen Schultz is a 34 y.o. female.  HPI   Patient presented to the ED for evaluation of vaginal bleeding.  Patient states she has been having recurrent issues with vaginal bleeding.  She has been seeing her OB/GYN doctor.  Last time the patient saw her doctor her hemoglobin had continued to decrease.  She was last in the office on November 16.  Patient states her bleeding had actually stopped at that time.  She did have repeat laboratory tests.  Her doctor found her to be significantly anemic and was trying to arrange for an iron infusion.  Patient states she started having recurrent bleeding again since leaving the office later that evening.  Patient states she is passing large clots.  She is feeling fatigued.  She is feeling lightheaded.    Past Medical History:  Diagnosis Date  . Anemia   . Blood transfusion without reported diagnosis   . Diabetes mellitus without complication (HCC)   . Hypertension   . Obesity   . Thyroid disease     Patient Active Problem List   Diagnosis Date Noted  . Acute respiratory disease due to COVID-19 virus 08/22/2019  . Diabetes mellitus without complication (HCC)   . Hypertension   . Anemia   . Pneumonia due to COVID-19 virus   . Acute pain of right shoulder 02/05/2019    Past Surgical History:  Procedure Laterality Date  . CARPAL TUNNEL RELEASE    . CESAREAN SECTION    . LEEP    . THYROID SURGERY       OB History   No obstetric history on file.     No family history on file.  Social History   Tobacco Use  . Smoking status: Never Smoker  . Smokeless tobacco: Never Used  Vaping Use  . Vaping Use: Never used  Substance Use Topics  . Alcohol use: Yes    Comment: socially  . Drug use: No    Home Medications Prior to Admission  medications   Medication Sig Start Date End Date Taking? Authorizing Provider  albuterol (VENTOLIN HFA) 108 (90 Base) MCG/ACT inhaler Inhale 2 puffs into the lungs every 6 (six) hours as needed for wheezing or shortness of breath. 08/31/19   Jae Dire, MD  benzonatate (TESSALON PERLES) 100 MG capsule Take 1 capsule (100 mg total) by mouth every 6 (six) hours as needed for cough. 08/31/19 08/30/20  Jae Dire, MD  Iron-FA-B Cmp-C-Biot-Probiotic (FUSION PLUS) CAPS Take 1 capsule by mouth in the morning, at noon, and at bedtime. Take 1 capsule by mouth three times daily    [provider]  letrozole (FEMARA) 2.5 MG tablet Take 2.5 mg by mouth See admin instructions. Take 1 tablet (2.5mg ) daily on days 5,6,7,8,9 of cycle 07/24/19   [provider]  lisinopril (PRINIVIL,ZESTRIL) 2.5 MG tablet Take 2.5 mg by mouth daily.    [provider]  medroxyPROGESTERone (PROVERA) 10 MG tablet Take 20 mg by mouth See admin instructions. Take 2 tablets (20mg ) by mouth every morning for 5 days to start period 07/24/19   [provider]  metFORMIN (GLUCOPHAGE-XR) 500 MG 24 hr tablet Take 500 mg by mouth 2 (two) times daily. 05/07/19   [provider]  pantoprazole (PROTONIX) 40 MG tablet  Take 1 tablet (40 mg total) by mouth daily. 08/31/19 09/30/19  Jae Dire, MD  Prenatal 27-1 MG TABS Take 1 tablet by mouth daily. 03/12/19   [provider]  Vitamin D, Ergocalciferol, (DRISDOL) 1.25 MG (50000 UNIT) CAPS capsule Take 50,000 Units by mouth once a week. 05/07/19   [provider]    Allergies    Adhesive [tape] and Cinnamon  Review of Systems   Review of Systems  All other systems reviewed and are negative.   Physical Exam Updated Vital Signs BP 125/71 (BP Location: Left Arm)   Pulse 86   Temp 97.7 F (36.5 C) (Oral)   Resp 18   Ht 1.651 m (5\' 5" )   Wt 59 kg   LMP 03/30/2020   SpO2 99%   BMI 21.63 kg/m   Physical Exam Vitals and  nursing note reviewed.  Constitutional:      General: She is not in acute distress.    Appearance: She is well-developed.  HENT:     Head: Normocephalic and atraumatic.     Right Ear: External ear normal.     Left Ear: External ear normal.  Eyes:     General: No scleral icterus.       Right eye: No discharge.        Left eye: No discharge.     Conjunctiva/sclera: Conjunctivae normal.  Neck:     Trachea: No tracheal deviation.  Cardiovascular:     Rate and Rhythm: Normal rate and regular rhythm.  Pulmonary:     Effort: Pulmonary effort is normal. No respiratory distress.     Breath sounds: Normal breath sounds. No stridor. No wheezing or rales.  Abdominal:     General: Bowel sounds are normal. There is no distension.     Palpations: Abdomen is soft.     Tenderness: There is no abdominal tenderness. There is no guarding or rebound.  Genitourinary:    Labia:        Right: No rash or tenderness.        Left: No rash or tenderness.      Cervix: Cervical bleeding present. No cervical motion tenderness or friability.     Uterus: Not tender.      Adnexa: Right adnexa normal and left adnexa normal.       Right: No mass.         Left: No mass.       Comments: Blood in the vaginal vault, no heavy bleeding,  Musculoskeletal:        General: No tenderness.     Cervical back: Neck supple.  Skin:    General: Skin is warm and dry.     Findings: No rash.  Neurological:     Mental Status: She is alert.     Cranial Nerves: No cranial nerve deficit (no facial droop, extraocular movements intact, no slurred speech).     Sensory: No sensory deficit.     Motor: No abnormal muscle tone or seizure activity.     Coordination: Coordination normal.     ED Results / Procedures / Treatments   Labs (all labs ordered are listed, but only abnormal results are displayed) Labs Reviewed  CBC WITH DIFFERENTIAL/PLATELET - Abnormal; Notable for the following components:      Result Value   RBC 3.49  (*)    Hemoglobin 6.5 (*)    HCT 23.1 (*)    MCV 66.2 (*)    MCH 18.6 (*)  MCHC 28.1 (*)    RDW 19.9 (*)    All other components within normal limits  COMPREHENSIVE METABOLIC PANEL - Abnormal; Notable for the following components:   Glucose, Bld 127 (*)    Total Bilirubin 0.2 (*)    All other components within normal limits  PREGNANCY, URINE  POC URINE PREG, ED     Procedures .Critical Care Performed by: Linwood Dibbles, MD Authorized by: Linwood Dibbles, MD   Critical care provider statement:    Critical care time (minutes):  35   Critical care was time spent personally by me on the following activities:  Discussions with consultants, evaluation of patient's response to treatment, examination of patient, ordering and performing treatments and interventions, ordering and review of laboratory studies, ordering and review of radiographic studies, pulse oximetry, re-evaluation of patient's condition, obtaining history from patient or surrogate and review of old charts   (including critical care time)  Medications Ordered in ED Medications - No data to display  ED Course  I have reviewed the triage vital signs and the nursing notes.  Pertinent labs & imaging results that were available during my care of the patient were reviewed by me and considered in my medical decision making (see chart for details).  Clinical Course as of Apr 01 1713  Fri Apr 01, 2020  1500 Hgb 6.8 on 11/17   [JK]  1616 Pregnancy test negative   [JK]    Clinical Course User Index [JK] Linwood Dibbles, MD   MDM Rules/Calculators/A&P                         Patient presents with recurrent vaginal bleeding.  She was recently seen by her OB/GYN doctor but the bleeding had resolved at that time.  Plans were made for iron infusion.  Patient never started bleeding again.  She is having increasing fatigue and weakness.  Patient's hemoglobin is now 6.5.  Fortunately no signs of heavy bleeding now on pelvic exam.  Patient  will require a blood transfusion but she is at a freestanding emergency room and we do not have blood products here.  I spoke with Dr. Merilynn Finland on call for Dr. Rito Ehrlich.  He agrees with transfer to [facility] emergency room for further treatment.  Final Clinical Impression(s) / ED Diagnoses Final diagnoses:  Menorrhalgia  Blood loss anemia      Linwood Dibbles, MD 04/01/20 1714

## 2020-04-01 NOTE — ED Triage Notes (Signed)
Co vaginal bleeding x 1 month, seen by GYN last week for same

## 2020-04-01 NOTE — ED Notes (Signed)
[facility] at bedside 

## 2020-04-01 NOTE — ED Notes (Signed)
ED Provider at bedside. 

## 2020-04-01 NOTE — ED Notes (Signed)
pts family member refusing to go to car, d/t wait in lobby

## 2020-09-10 ENCOUNTER — Other Ambulatory Visit: Payer: Self-pay

## 2020-09-10 ENCOUNTER — Encounter (HOSPITAL_BASED_OUTPATIENT_CLINIC_OR_DEPARTMENT_OTHER): Payer: Self-pay | Admitting: Emergency Medicine

## 2020-09-10 ENCOUNTER — Emergency Department (HOSPITAL_BASED_OUTPATIENT_CLINIC_OR_DEPARTMENT_OTHER)
Admission: EM | Admit: 2020-09-10 | Discharge: 2020-09-10 | Disposition: A | Discharge status: Home / Self Care | Payer: Medicaid Other | Attending: Emergency Medicine | Admitting: Emergency Medicine

## 2020-09-10 DIAGNOSIS — E119 Type 2 diabetes mellitus without complications: Secondary | ICD-10-CM | POA: Insufficient documentation

## 2020-09-10 DIAGNOSIS — Z79899 Other long term (current) drug therapy: Secondary | ICD-10-CM | POA: Diagnosis not present

## 2020-09-10 DIAGNOSIS — Z7984 Long term (current) use of oral hypoglycemic drugs: Secondary | ICD-10-CM | POA: Diagnosis not present

## 2020-09-10 DIAGNOSIS — I10 Essential (primary) hypertension: Secondary | ICD-10-CM | POA: Diagnosis not present

## 2020-09-10 DIAGNOSIS — N939 Abnormal uterine and vaginal bleeding, unspecified: Secondary | ICD-10-CM | POA: Diagnosis present

## 2020-09-10 DIAGNOSIS — N946 Dysmenorrhea, unspecified: Secondary | ICD-10-CM

## 2020-09-10 DIAGNOSIS — Z8616 Personal history of COVID-19: Secondary | ICD-10-CM | POA: Diagnosis not present

## 2020-09-10 LAB — WET PREP, GENITAL
Clue Cells Wet Prep HPF POC: NONE SEEN
Sperm: NONE SEEN
Trich, Wet Prep: NONE SEEN
Yeast Wet Prep HPF POC: NONE SEEN

## 2020-09-10 LAB — HCG, SERUM, QUALITATIVE: Preg, Serum: NEGATIVE

## 2020-09-10 MED ORDER — NAPROXEN 250 MG PO TABS
500.0000 mg | ORAL_TABLET | Freq: Once | ORAL | Status: AC
  Administered 2020-09-10: 500 mg via ORAL
  Filled 2020-09-10: qty 2

## 2020-09-10 MED ORDER — FERROUS SULFATE 325 (65 FE) MG PO TABS: 325.0000 mg | ORAL_TABLET | Freq: Three times a day (TID) | ORAL | 0 refills | Status: DC

## 2020-09-10 MED ORDER — NAPROXEN 375 MG PO TABS: 375.0000 mg | ORAL_TABLET | Freq: Two times a day (BID) | ORAL | 0 refills | Status: DC

## 2020-09-10 MED ORDER — MEDROXYPROGESTERONE ACETATE 10 MG PO TABS: 20.0000 mg | ORAL_TABLET | ORAL | 0 refills | Status: DC

## 2020-09-10 MED ORDER — ONDANSETRON 4 MG PO TBDP
8.0000 mg | ORAL_TABLET | Freq: Once | ORAL | Status: AC
  Administered 2020-09-10: 8 mg via ORAL
  Filled 2020-09-10: qty 2

## 2020-09-10 MED ORDER — ACETAMINOPHEN 500 MG PO TABS
1000.0000 mg | ORAL_TABLET | Freq: Once | ORAL | Status: AC
  Administered 2020-09-10: 1000 mg via ORAL
  Filled 2020-09-10: qty 2

## 2020-09-10 NOTE — ED Provider Notes (Signed)
[facility] EMERGENCY DEPARTMENT Provider Note   CSN: [id number] Arrival date & time: 09/10/20  0235     History Chief Complaint  Patient presents with  . Vaginal Bleeding    Colleen Schultz is a 35 y.o. female.  The history is provided by the patient.  Vaginal Bleeding Quality:  Dark red and clots Severity:  Mild Onset quality:  Gradual Duration:  1 day Timing:  Intermittent Progression:  Worsening Chronicity:  Recurrent Menstrual history:  Irregular Number of pads used:  1 Number of tampons used:  0 Possible pregnancy: no   Context: spontaneously   Relieved by:  Nothing Worsened by:  Nothing Ineffective treatments:  None tried Associated symptoms: no abdominal pain, no back pain, no dizziness, no dysuria, no fever and no vaginal discharge   Associated symptoms comment:  Cramps and pressure  Risk factors: no bleeding disorder, no STD and no STD exposure        Past Medical History:  Diagnosis Date  . Anemia   . Blood transfusion without reported diagnosis   . Diabetes mellitus without complication (HCC)   . Hypertension   . Obesity   . Thyroid disease     Patient Active Problem List   Diagnosis Date Noted  . Acute respiratory disease due to COVID-19 virus 08/22/2019  . Diabetes mellitus without complication (HCC)   . Hypertension   . Anemia   . Pneumonia due to COVID-19 virus   . Acute pain of right shoulder 02/05/2019    Past Surgical History:  Procedure Laterality Date  . CARPAL TUNNEL RELEASE    . CESAREAN SECTION    . LEEP    . THYROID SURGERY       OB History   No obstetric history on file.     History reviewed. No pertinent family history.  Social History   Tobacco Use  . Smoking status: Never Smoker  . Smokeless tobacco: Never Used  Vaping Use  . Vaping Use: Never used  Substance Use Topics  . Alcohol use: Yes    Comment: socially  . Drug use: No    Home Medications Prior to Admission medications   Medication  Sig Start Date End Date Taking? Authorizing Provider  ferrous sulfate 325 (65 FE) MG tablet Take 1 tablet (325 mg total) by mouth 3 (three) times daily with meals. 09/10/20  Yes Rayelynn Loyal, MD  naproxen (NAPROSYN) 375 MG tablet Take 1 tablet (375 mg total) by mouth 2 (two) times daily. 09/10/20  Yes Zakari Bathe, MD  albuterol (VENTOLIN HFA) 108 (90 Base) MCG/ACT inhaler Inhale 2 puffs into the lungs every 6 (six) hours as needed for wheezing or shortness of breath. 08/31/19   Jae Dire, MD  Iron-FA-B Cmp-C-Biot-Probiotic (FUSION PLUS) CAPS Take 1 capsule by mouth in the morning, at noon, and at bedtime. Take 1 capsule by mouth three times daily    [provider]  letrozole (FEMARA) 2.5 MG tablet Take 2.5 mg by mouth See admin instructions. Take 1 tablet (2.5mg ) daily on days 5,6,7,8,9 of cycle 07/24/19   [provider]  lisinopril (PRINIVIL,ZESTRIL) 2.5 MG tablet Take 2.5 mg by mouth daily.    [provider]  medroxyPROGESTERone (PROVERA) 10 MG tablet Take 2 tablets (20 mg total) by mouth See admin instructions. Take 2 tablets (20mg ) by mouth every morning for 5 days to start period 09/10/20   Tenzin Pavon, MD  metFORMIN (GLUCOPHAGE-XR) 500 MG 24 hr tablet Take 500 mg by mouth  2 (two) times daily. 05/07/19   [provider]  pantoprazole (PROTONIX) 40 MG tablet Take 1 tablet (40 mg total) by mouth daily. 08/31/19 09/30/19  Jae Dire, MD  Prenatal 27-1 MG TABS Take 1 tablet by mouth daily. 03/12/19   [provider]  Vitamin D, Ergocalciferol, (DRISDOL) 1.25 MG (50000 UNIT) CAPS capsule Take 50,000 Units by mouth once a week. 05/07/19   [provider]    Allergies    Adhesive [tape] and Cinnamon  Review of Systems   Review of Systems  Constitutional: Negative for fever.  HENT: Negative for congestion.   Eyes: Negative for visual disturbance.  Respiratory: Negative for shortness of breath.   Cardiovascular: Negative for chest  pain.  Gastrointestinal: Negative for abdominal pain.  Genitourinary: Positive for pelvic pain and vaginal bleeding. Negative for dysuria and vaginal discharge.  Musculoskeletal: Negative for back pain.  Skin: Negative for rash.  Neurological: Negative for dizziness.  Psychiatric/Behavioral: Negative for agitation.  All other systems reviewed and are negative.   Physical Exam Updated Vital Signs BP (!) 139/96   Pulse 80   Temp 98.3 F (36.8 C) (Oral)   Resp 18   Ht 5\' 5"  (1.651 m)   Wt 121.1 kg   LMP 09/09/2020 (Exact Date)   SpO2 100%   BMI 44.43 kg/m   Physical Exam Vitals and nursing note reviewed. Exam conducted with a chaperone present.  Constitutional:      General: She is not in acute distress.    Appearance: Normal appearance.  HENT:     Head: Normocephalic and atraumatic.     Nose: Nose normal.  Eyes:     Conjunctiva/sclera: Conjunctivae normal.     Pupils: Pupils are equal, round, and reactive to light.  Cardiovascular:     Rate and Rhythm: Normal rate and regular rhythm.     Pulses: Normal pulses.     Heart sounds: Normal heart sounds.  Pulmonary:     Effort: Pulmonary effort is normal.     Breath sounds: Normal breath sounds.  Abdominal:     General: Abdomen is flat. Bowel sounds are normal.     Palpations: Abdomen is soft.     Tenderness: There is no abdominal tenderness. There is no guarding or rebound.  Genitourinary:    Comments: Scant vaginal bleeding, no CMT, chaperone present  Musculoskeletal:        General: Normal range of motion.     Cervical back: Normal range of motion and neck supple.  Skin:    General: Skin is warm and dry.     Capillary Refill: Capillary refill takes less than 2 seconds.  Neurological:     General: No focal deficit present.     Mental Status: She is alert and oriented to person, place, and time.     Deep Tendon Reflexes: Reflexes normal.  Psychiatric:        Mood and Affect: Mood normal.        Behavior: Behavior  normal.     ED Results / Procedures / Treatments   Labs (all labs ordered are listed, but only abnormal results are displayed) Labs Reviewed  WET PREP, GENITAL - Abnormal; Notable for the following components:      Result Value   WBC, Wet Prep HPF POC RARE (*)    All other components within normal limits  HCG, SERUM, QUALITATIVE  GC/CHLAMYDIA PROBE AMP ([place]) NOT AT [facility]    EKG None  Radiology No results  found.  Procedures Procedures   Medications Ordered in ED Medications  ondansetron (ZOFRAN-ODT) disintegrating tablet 8 mg (has no administration in time range)  naproxen (NAPROSYN) tablet 500 mg (has no administration in time range)    ED Course  I have reviewed the triage vital signs and the nursing notes.  Pertinent labs & imaging results that were available during my care of the patient were reviewed by me and considered in my medical decision making (see chart for details).    MDM Rules/Calculators/A&P  Well appearing less than one pad in 24 hours.  No indication for labs at this time.  Follow up on Monday with your GYN.    Colleen Schultz was evaluated in Emergency Department on 09/10/2020 for the symptoms described in the history of present illness. She was evaluated in the context of the global COVID-19 pandemic, which necessitated consideration that the patient might be at risk for infection with the SARS-CoV-2 virus that causes COVID-19. Institutional protocols and algorithms that pertain to the evaluation of patients at risk for COVID-19 are in a state of rapid change based on information released by regulatory bodies including the CDC and federal and state organizations. These policies and algorithms were followed during the patient's care in the ED.  Final Clinical Impression(s) / ED Diagnoses Final diagnoses:  None   Return for intractable cough, coughing up blood, fevers >100.4 unrelieved by medication, shortness of breath, intractable vomiting,  chest pain, shortness of breath, weakness, numbness, changes in speech, facial asymmetry, abdominal pain, passing out, Inability to tolerate liquids or food, cough, altered mental status or any concerns. No signs of systemic illness or infection. The patient is nontoxic-appearing on exam and vital signs are within normal limits.  I have reviewed the triage vital signs and the nursing notes. Pertinent labs & imaging results that were available during my care of the patient were reviewed by me and considered in my medical decision making (see chart for details). After history, exam, and medical workup I feel the patient has been appropriately medically screened and is safe for discharge home. Pertinent diagnoses were discussed with the patient. Patient was given return precautions.  Rx / DC Orders ED Discharge Orders         Ordered    medroxyPROGESTERone (PROVERA) 10 MG tablet  See admin instructions        09/10/20 0249    ferrous sulfate 325 (65 FE) MG tablet  3 times daily with meals        09/10/20 0249    naproxen (NAPROSYN) 375 MG tablet  2 times daily        09/10/20 0347           Gorden Stthomas, MD 09/10/20 [id number]

## 2020-09-10 NOTE — ED Triage Notes (Signed)
Pt reports period was 12 days late and started having mild vaginal bleeding yesterday. Pt reports vaginal bleeding has increased and passing clots. Also experiencing a lot of pelvic pressure.

## 2020-09-12 LAB — GC/CHLAMYDIA PROBE AMP (~~LOC~~) NOT AT ARMC
Chlamydia: NEGATIVE
Comment: NEGATIVE
Comment: NORMAL
Neisseria Gonorrhea: NEGATIVE

## 2021-02-09 ENCOUNTER — Emergency Department (HOSPITAL_BASED_OUTPATIENT_CLINIC_OR_DEPARTMENT_OTHER): Payer: Medicaid Other

## 2021-02-09 ENCOUNTER — Emergency Department (HOSPITAL_BASED_OUTPATIENT_CLINIC_OR_DEPARTMENT_OTHER)
Admission: EM | Admit: 2021-02-09 | Discharge: 2021-02-09 | Disposition: A | Discharge status: Home / Self Care | Payer: Medicaid Other | Attending: Emergency Medicine | Admitting: Emergency Medicine

## 2021-02-09 ENCOUNTER — Other Ambulatory Visit: Payer: Self-pay

## 2021-02-09 ENCOUNTER — Encounter (HOSPITAL_BASED_OUTPATIENT_CLINIC_OR_DEPARTMENT_OTHER): Payer: Self-pay | Admitting: Radiology

## 2021-02-09 DIAGNOSIS — R0789 Other chest pain: Secondary | ICD-10-CM | POA: Diagnosis not present

## 2021-02-09 DIAGNOSIS — R791 Abnormal coagulation profile: Secondary | ICD-10-CM | POA: Diagnosis not present

## 2021-02-09 DIAGNOSIS — I10 Essential (primary) hypertension: Secondary | ICD-10-CM | POA: Insufficient documentation

## 2021-02-09 DIAGNOSIS — N3000 Acute cystitis without hematuria: Secondary | ICD-10-CM | POA: Diagnosis not present

## 2021-02-09 DIAGNOSIS — Z79899 Other long term (current) drug therapy: Secondary | ICD-10-CM | POA: Diagnosis not present

## 2021-02-09 DIAGNOSIS — Z7984 Long term (current) use of oral hypoglycemic drugs: Secondary | ICD-10-CM | POA: Insufficient documentation

## 2021-02-09 DIAGNOSIS — R1011 Right upper quadrant pain: Secondary | ICD-10-CM

## 2021-02-09 DIAGNOSIS — D72829 Elevated white blood cell count, unspecified: Secondary | ICD-10-CM | POA: Insufficient documentation

## 2021-02-09 DIAGNOSIS — E119 Type 2 diabetes mellitus without complications: Secondary | ICD-10-CM | POA: Diagnosis not present

## 2021-02-09 LAB — CBC
HCT: 30.8 % — ABNORMAL LOW (ref 36.0–46.0)
Hemoglobin: 9 g/dL — ABNORMAL LOW (ref 12.0–15.0)
MCH: 20.6 pg — ABNORMAL LOW (ref 26.0–34.0)
MCHC: 29.2 g/dL — ABNORMAL LOW (ref 30.0–36.0)
MCV: 70.5 fL — ABNORMAL LOW (ref 80.0–100.0)
Platelets: 413 10*3/uL — ABNORMAL HIGH (ref 150–400)
RBC: 4.37 MIL/uL (ref 3.87–5.11)
RDW: 17.8 % — ABNORMAL HIGH (ref 11.5–15.5)
WBC: 7.9 10*3/uL (ref 4.0–10.5)
nRBC: 0 % (ref 0.0–0.2)

## 2021-02-09 LAB — URINALYSIS, ROUTINE W REFLEX MICROSCOPIC
Bilirubin Urine: NEGATIVE
Glucose, UA: NEGATIVE mg/dL
Hgb urine dipstick: NEGATIVE
Ketones, ur: NEGATIVE mg/dL
Nitrite: NEGATIVE
Protein, ur: NEGATIVE mg/dL
Specific Gravity, Urine: 1.025 (ref 1.005–1.030)
pH: 6 (ref 5.0–8.0)

## 2021-02-09 LAB — LIPASE, BLOOD: Lipase: 26 U/L (ref 11–51)

## 2021-02-09 LAB — BASIC METABOLIC PANEL
Anion gap: 8 (ref 5–15)
BUN: 15 mg/dL (ref 6–20)
CO2: 21 mmol/L — ABNORMAL LOW (ref 22–32)
Calcium: 9.2 mg/dL (ref 8.9–10.3)
Chloride: 109 mmol/L (ref 98–111)
Creatinine, Ser: 0.98 mg/dL (ref 0.44–1.00)
GFR, Estimated: >60 mL/min (ref >60)
Glucose, Bld: 103 mg/dL — ABNORMAL HIGH (ref 70–99)
Potassium: 4.2 mmol/L (ref 3.5–5.1)
Sodium: 138 mmol/L (ref 135–145)

## 2021-02-09 LAB — HEPATIC FUNCTION PANEL
ALT: 13 U/L (ref 0–44)
AST: 18 U/L (ref 15–41)
Albumin: 4 g/dL (ref 3.5–5.0)
Alkaline Phosphatase: 48 U/L (ref 38–126)
Bilirubin, Direct: 0.1 mg/dL (ref 0.0–0.2)
Indirect Bilirubin: 0.4 mg/dL (ref 0.3–0.9)
Total Bilirubin: 0.5 mg/dL (ref 0.3–1.2)
Total Protein: 8.5 g/dL — ABNORMAL HIGH (ref 6.5–8.1)

## 2021-02-09 LAB — PREGNANCY, URINE: Preg Test, Ur: NEGATIVE

## 2021-02-09 LAB — URINALYSIS, MICROSCOPIC (REFLEX)

## 2021-02-09 LAB — TROPONIN I (HIGH SENSITIVITY)
Troponin I (High Sensitivity): <2 ng/L (ref <18)
Troponin I (High Sensitivity): <2 ng/L (ref <18)

## 2021-02-09 LAB — D-DIMER, QUANTITATIVE: D-Dimer, Quant: 0.9 ug/mL-FEU — ABNORMAL HIGH (ref 0.00–0.50)

## 2021-02-09 MED ORDER — CEPHALEXIN 500 MG PO CAPS: 500.0000 mg | ORAL_CAPSULE | Freq: Two times a day (BID) | ORAL | 0 refills | Status: AC

## 2021-02-09 MED ORDER — IOHEXOL 350 MG/ML SOLN
100.0000 mL | Freq: Once | INTRAVENOUS | Status: AC | PRN
  Administered 2021-02-09: 100 mL via INTRAVENOUS

## 2021-02-09 NOTE — ED Provider Notes (Signed)
[facility] EMERGENCY DEPARTMENT Provider Note   CSN: [id number] Arrival date & time: 02/09/21  0941     History Chief Complaint  Patient presents with   Chest Pain    Colleen Schultz is a 35 y.o. female.  The history is provided by the patient. No language interpreter was used.  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   Risk factors: no coronary artery disease and not female       Past Medical History:  Diagnosis Date   Anemia    Blood transfusion without reported diagnosis    Diabetes mellitus without complication (HCC)    Hypertension    Obesity    Thyroid disease     Patient Active Problem List   Diagnosis Date Noted   Acute respiratory disease due to COVID-19 virus 08/22/2019   Diabetes mellitus without complication (HCC)    Hypertension    Anemia    Pneumonia due to COVID-19 virus    Acute pain of right shoulder 02/05/2019    Past Surgical History:  Procedure Laterality Date   CARPAL TUNNEL RELEASE     CESAREAN SECTION     LEEP     THYROID SURGERY       OB History   No obstetric history on file.     No family history on file.  Social History   Tobacco Use   Smoking status: Never   Smokeless tobacco: Never  Vaping Use   Vaping Use: Never used  Substance Use Topics   Alcohol use: Yes    Comment: socially   Drug use: No    Home Medications Prior to Admission  medications   Medication Sig Start Date End Date Taking? Authorizing Provider  albuterol (VENTOLIN HFA) 108 (90 Base) MCG/ACT inhaler Inhale 2 puffs into the lungs every 6 (six) hours as needed for wheezing or shortness of breath. 08/31/19   Jae Dire, MD  ferrous sulfate 325 (65 FE) MG tablet Take 1 tablet (325 mg total) by mouth 3 (three) times daily with meals. 09/10/20   Palumbo, April, MD  Iron-FA-B Cmp-C-Biot-Probiotic (FUSION PLUS) CAPS Take 1 capsule by mouth in the morning, at noon, and at bedtime. Take 1 capsule by mouth three times daily    [provider]  letrozole (FEMARA) 2.5 MG tablet Take 2.5 mg by mouth See admin instructions. Take 1 tablet (2.5mg ) daily on days 5,6,7,8,9 of cycle 07/24/19   [provider]  lisinopril (PRINIVIL,ZESTRIL) 2.5 MG tablet Take 2.5 mg by mouth daily.    [provider]  medroxyPROGESTERone (PROVERA) 10 MG tablet Take 2 tablets (20 mg total) by mouth See admin instructions. Take 2 tablets (20mg ) by mouth every morning for 5 days to start period 09/10/20   Palumbo, April, MD  metFORMIN (GLUCOPHAGE-XR) 500 MG 24 hr tablet Take 500 mg by mouth 2 (two) times daily. 05/07/19   [provider]  naproxen (NAPROSYN) 375 MG  tablet Take 1 tablet (375 mg total) by mouth 2 (two) times daily. 09/10/20   Palumbo, April, MD  pantoprazole (PROTONIX) 40 MG tablet Take 1 tablet (40 mg total) by mouth daily. 08/31/19 09/30/19  Jae Dire, MD  Prenatal 27-1 MG TABS Take 1 tablet by mouth daily. 03/12/19   [provider]  Vitamin D, Ergocalciferol, (DRISDOL) 1.25 MG (50000 UNIT) CAPS capsule Take 50,000 Units by mouth once a week. 05/07/19   [provider]    Allergies    Adhesive [tape] and Cinnamon  Review of Systems   Review of Systems  Constitutional:  Positive for fatigue. Negative for chills and fever.  HENT:  Negative for congestion.   Respiratory:  Negative for cough, chest tightness, shortness of breath  and wheezing.   Cardiovascular:  Positive for chest pain. Negative for palpitations, leg swelling and syncope.  Gastrointestinal:  Positive for nausea. Negative for abdominal pain, constipation, diarrhea and vomiting.  Genitourinary:  Negative for dysuria and flank pain.  Musculoskeletal:  Positive for back pain. Negative for neck pain and neck stiffness.  Skin:  Negative for rash and wound.  Neurological:  Negative for light-headedness and headaches.  Psychiatric/Behavioral:  Negative for agitation.   All other systems reviewed and are negative.  Physical Exam Updated Vital Signs BP 117/70   Pulse 74   Temp 98.1 F (36.7 C) (Oral)   Resp 20   Ht 5\' 2"  (1.575 m)   Wt 125.2 kg   SpO2 99%   BMI 50.48 kg/m   Physical Exam Vitals and nursing note reviewed.  Constitutional:      General: Colleen Schultz is not in acute distress.    Appearance: Colleen Schultz is well-developed. Colleen Schultz is not ill-appearing, toxic-appearing or diaphoretic.  HENT:     Head: Normocephalic and atraumatic.  Eyes:     Conjunctiva/sclera: Conjunctivae normal.     Pupils: Pupils are equal, round, and reactive to light.  Cardiovascular:     Rate and Rhythm: Normal rate and regular rhythm.     Heart sounds: Normal heart sounds. No murmur heard. Pulmonary:     Effort: Pulmonary effort is normal. No respiratory distress.     Breath sounds: Normal breath sounds. No decreased breath sounds, wheezing, rhonchi or rales.  Chest:     Chest wall: Tenderness present.  Abdominal:     Palpations: Abdomen is soft.     Tenderness: There is no abdominal tenderness.  Musculoskeletal:        General: Normal range of motion.     Cervical back: Neck supple.  Skin:    General: Skin is warm and dry.     Capillary Refill: Capillary refill takes less than 2 seconds.  Neurological:     General: No focal deficit present.     Mental Status: Colleen Schultz is alert and oriented to person, place, and time.     Cranial Nerves: No cranial nerve deficit.   Psychiatric:        Mood and Affect: Mood is anxious.    ED Results / Procedures / Treatments   Labs (all labs ordered are listed, but only abnormal results are displayed) Labs Reviewed  BASIC METABOLIC PANEL - Abnormal; Notable for the following components:      Result Value   CO2 21 (*)    Glucose, Bld 103 (*)    All other components within normal limits  CBC - Abnormal; Notable for the following components:   Hemoglobin 9.0 (*)    HCT 30.8 (*)  MCV 70.5 (*)    MCH 20.6 (*)    MCHC 29.2 (*)    RDW 17.8 (*)    Platelets 413 (*)    All other components within normal limits  URINALYSIS, ROUTINE W REFLEX MICROSCOPIC - Abnormal; Notable for the following components:   Leukocytes,Ua SMALL (*)    All other components within normal limits  URINALYSIS, MICROSCOPIC (REFLEX) - Abnormal; Notable for the following components:   Bacteria, UA RARE (*)    All other components within normal limits  HEPATIC FUNCTION PANEL - Abnormal; Notable for the following components:   Total Protein 8.5 (*)    All other components within normal limits  D-DIMER, QUANTITATIVE - Abnormal; Notable for the following components:   D-Dimer, Quant 0.90 (*)    All other components within normal limits  PREGNANCY, URINE  LIPASE, BLOOD  TROPONIN I (HIGH SENSITIVITY)  TROPONIN I (HIGH SENSITIVITY)    EKG EKG Interpretation  Date/Time:  Thursday February 09 2021 10:00:45 EDT Ventricular Rate:  84 PR Interval:  109 QRS Duration: 88 QT Interval:  412 QTC Calculation: 487 R Axis:   66 Text Interpretation: Unknown rhythm, irregular rate Short PR interval Borderline prolonged QT interval when compared to prior, similar appearance with more artifact. No STEMI Confirmed by Theda Belfast ([id number]) on 02/09/2021 10:35:53 AM  Radiology DG Chest 2 View  Result Date: 02/09/2021 CLINICAL DATA:  Chest pain EXAM: CHEST - 2 VIEW COMPARISON:  02/08/2021 FINDINGS: Heart and mediastinal contours are within normal  limits. No focal opacities or effusions. No acute bony abnormality. IMPRESSION: No active cardiopulmonary disease. Electronically Signed   By: Charlett Nose M.D.   On: 02/09/2021 10:45   CT Angio Chest PE W and/or Wo Contrast  Result Date: 02/09/2021 CLINICAL DATA:  Rule out pulmonary embolus. EXAM: CT ANGIOGRAPHY CHEST WITH CONTRAST TECHNIQUE: Multidetector CT imaging of the chest was performed using the standard protocol during bolus administration of intravenous contrast. Multiplanar CT image reconstructions and MIPs were obtained to evaluate the vascular anatomy. CONTRAST:  OMNIPAQUE IOHEXOL 350 MG/ML SOLN COMPARISON:  11/17/2019 FINDINGS: Cardiovascular: Satisfactory opacification of the pulmonary arteries to the segmental level. No evidence of pulmonary embolism. Normal heart size. No pericardial effusion. Mediastinum/Nodes: No enlarged mediastinal, hilar, or axillary lymph nodes. Thyroid gland, trachea, and esophagus demonstrate no significant findings. Lungs/Pleura: No pleural effusion, airspace consolidation or pneumothorax. Dependent changes with subsegmental atelectasis noted within the posterior lung bases. Upper Abdomen: No acute findings. Musculoskeletal: No chest wall abnormality. No acute or significant osseous findings. Review of the MIP images confirms the above findings. IMPRESSION: 1. No evidence for acute pulmonary embolus. 2. Dependent changes with subsegmental atelectasis noted within the posterior lung bases. Electronically Signed   By: Signa Kell M.D.   On: 02/09/2021 15:07    Procedures Procedures   Medications Ordered in ED Medications  iohexol (OMNIPAQUE) 350 MG/ML injection 100 mL (100 mLs Intravenous Contrast Given 02/09/21 1410)    ED Course  I have reviewed the triage vital signs and the nursing notes.  Pertinent labs & imaging results that were available during my care of the patient were reviewed by me and considered in my medical decision making (see chart  for details).    MDM Rules/Calculators/A&P                           MILADY FLEENER is a 35 y.o. female with a past medical history significant for hypertension, anemia,  diabetes, previous DVT, and obesity who presents with foul-smelling urine and pleuritic chest pain rating around towards her back.  Patient reports that for the last few days Colleen Schultz has had pain in her right central chest that radiates around towards her back that is worse with deep breathing and palpation.  Colleen Schultz says that Colleen Schultz has not had significant shortness of breath but it has bothered her.  Colleen Schultz reports that Colleen Schultz got evaluated yesterday and had reassuring work-up but the symptoms have persisted today and Colleen Schultz is also concerned about UTI now.  Colleen Schultz reports her urine has smelled different and Colleen Schultz has had history of UTIs in the past.  Colleen Schultz suspects UTI is going on.  Colleen Schultz does agree that anxiety is likely playing a role in her symptoms.  Colleen Schultz denies any history of gallbladder disease but does agree that the pain is sometimes in the very upper abdomen.  On exam, lungs are clear and chest is tender palpation.  Right upper quadrant epigastric area are also tender to deep palpation.  No rash seen to suggest shingles.  Normal bowel sounds.  Legs nontender nonedematous.  Otherwise patient resting comfortably.  Patient's EKG did not show evidence of acute STEMI.  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  D-dimer was elevated so a CT PE study was ordered.  CT PE study was negative for clot.  Otherwise, troponin was negative x2.  Lipase not elevated.  Hepatic function not elevated.  Hemoglobin is improved from prior and white count is normal.  Urinalysis does show leukocytes and bacteria so given the urinary changes and history of UTIs, it is appropriate that Colleen Schultz is treated for urinary tract infection.  Culture will be sent as well.  Given the tenderness in the right upper quadrant and  epigastric area and description of symptoms, we did order a right upper quadrant ultrasound.  The technologist for ultrasound has not been available and Colleen Schultz is awaiting this test.  If this is negative, dissipate discharge with antibiotics for UTI and likely atypical chest pain related to anxiety.   Care transferred to oncoming team while waiting for results of ultrasound.  If ultrasound is reassuring, dissipate discharge home with antibiotics as previously planned.  Final Clinical Impression(s) / ED Diagnoses Final diagnoses:  RUQ abdominal pain  Atypical chest pain  Acute cystitis without hematuria    Rx / DC Orders ED Discharge Orders          Ordered    cephALEXin (KEFLEX) 500 MG capsule  2 times daily        02/09/21 1551           Clinical Impression: 1. Atypical chest pain   2. RUQ abdominal pain   3. Acute cystitis without hematuria     Disposition: Care transferred to oncoming team while waiting for results of ultrasound.  If ultrasound is reassuring, dissipate discharge home with antibiotics as previously planned.  This note was prepared with assistance of Conservation officer, historic buildings. Occasional wrong-word or sound-a-like substitutions may have occurred due to the inherent limitations of voice recognition software.     Payten Hobin, Canary Brim, MD 02/09/21 [phone]

## 2021-02-09 NOTE — ED Triage Notes (Signed)
Pt c/o R sided CP and back pain stating she thinks she has a UTI. Evaluated yesterday at [facility] for CP and anxiety. Denies urinary symptoms currently.

## 2021-02-09 NOTE — Discharge Instructions (Signed)
Please follow-up with your primary doctor for further management of your chronic discomfort.  Please take the antibiotics for the urinary tract infection we discovered today.  Please rest and stay hydrated.  If any symptoms change or worsen, please return to the nearest emergency room.

## 2021-03-12 ENCOUNTER — Emergency Department (HOSPITAL_BASED_OUTPATIENT_CLINIC_OR_DEPARTMENT_OTHER)
Admission: EM | Admit: 2021-03-12 | Discharge: 2021-03-13 | Disposition: A | Discharge status: Home / Self Care | Payer: Medicaid Other | Attending: Emergency Medicine | Admitting: Emergency Medicine

## 2021-03-12 ENCOUNTER — Other Ambulatory Visit: Payer: Self-pay

## 2021-03-12 ENCOUNTER — Emergency Department (HOSPITAL_BASED_OUTPATIENT_CLINIC_OR_DEPARTMENT_OTHER): Payer: Medicaid Other

## 2021-03-12 ENCOUNTER — Encounter (HOSPITAL_BASED_OUTPATIENT_CLINIC_OR_DEPARTMENT_OTHER): Payer: Self-pay | Admitting: Emergency Medicine

## 2021-03-12 DIAGNOSIS — Z8616 Personal history of COVID-19: Secondary | ICD-10-CM | POA: Insufficient documentation

## 2021-03-12 DIAGNOSIS — H9203 Otalgia, bilateral: Secondary | ICD-10-CM | POA: Insufficient documentation

## 2021-03-12 DIAGNOSIS — R197 Diarrhea, unspecified: Secondary | ICD-10-CM | POA: Insufficient documentation

## 2021-03-12 DIAGNOSIS — J069 Acute upper respiratory infection, unspecified: Secondary | ICD-10-CM | POA: Insufficient documentation

## 2021-03-12 DIAGNOSIS — E119 Type 2 diabetes mellitus without complications: Secondary | ICD-10-CM | POA: Diagnosis not present

## 2021-03-12 DIAGNOSIS — I10 Essential (primary) hypertension: Secondary | ICD-10-CM | POA: Diagnosis not present

## 2021-03-12 DIAGNOSIS — Z20822 Contact with and (suspected) exposure to covid-19: Secondary | ICD-10-CM | POA: Insufficient documentation

## 2021-03-12 DIAGNOSIS — R059 Cough, unspecified: Secondary | ICD-10-CM | POA: Diagnosis present

## 2021-03-12 MED ORDER — ALBUTEROL SULFATE HFA 108 (90 BASE) MCG/ACT IN AERS
8.0000 | INHALATION_SPRAY | Freq: Once | RESPIRATORY_TRACT | Status: AC
  Administered 2021-03-13: 8 via RESPIRATORY_TRACT
  Filled 2021-03-12: qty 6.7

## 2021-03-12 MED ORDER — SODIUM CHLORIDE 0.9 % IV BOLUS
1000.0000 mL | Freq: Once | INTRAVENOUS | Status: AC
  Administered 2021-03-12: 1000 mL via INTRAVENOUS

## 2021-03-12 MED ORDER — DIPHENHYDRAMINE HCL 50 MG/ML IJ SOLN
25.0000 mg | Freq: Once | INTRAMUSCULAR | Status: AC
  Administered 2021-03-12: 25 mg via INTRAVENOUS
  Filled 2021-03-12: qty 1

## 2021-03-12 MED ORDER — PROCHLORPERAZINE EDISYLATE 10 MG/2ML IJ SOLN
10.0000 mg | Freq: Once | INTRAMUSCULAR | Status: AC
  Administered 2021-03-12: 10 mg via INTRAVENOUS
  Filled 2021-03-12: qty 2

## 2021-03-12 MED ORDER — AEROCHAMBER PLUS FLO-VU SMALL MISC
1.0000 | Freq: Once | Status: AC
  Administered 2021-03-13: 1
  Filled 2021-03-12: qty 1

## 2021-03-12 NOTE — ED Triage Notes (Signed)
Pt reports cough, body aches since Monday

## 2021-03-12 NOTE — ED Provider Notes (Signed)
[facility] EMERGENCY DEPARTMENT Provider Note   CSN: [id number] Arrival date & time: 03/12/21  2007     History Chief Complaint  Patient presents with   Cough   Generalized Body Aches    Colleen Schultz is a 35 y.o. female with a history of diabetes mellitus type 2, HTN, thyroid disease, anemia, and obesity who presents the emergency department with a chief complaint of headache.  The patient reports that 2 weeks ago that she developed chest tightness that has been constant since onset.  Has been waxing and waning in intensity.  About a week ago, the patient developed ear pain, nasal congestion and body aches.  She was seen by her PCP and had her ears checked and was told that she did not have an ear infection.  Approximately 4 days ago, the patient's symptoms worsened and she began having frequent episodes of nonbloody diarrhea.  Yesterday, she developed a cough.  She has since developed a sore throat, shortness of breath.  Cough and shortness of breath have been worsening since onset.  She has been using her home albuterol inhaler without significant improvement.  This morning, she also developed a fever as well as headache and sore throat.  She has been feeling increasingly weak and fatigued.  She has been treating her symptoms with NyQuil and ibuprofen.  Last dose of 400 mg of ibuprofen was at approximately 1900.  No other known aggravating or alleviating factors.  She reports that although she has been hydrating well, she has been increasingly thirsty and urinating more frequently.  She denies hematuria or dysuria, vomiting, vaginal bleeding or discharge, neck pain or stiffness, numbness, weakness, visual changes.  She reports multiple known sick contacts.  Reports that her significant other's children were recently diagnosed with the flu, but she has not had direct contact with them.  Her significant other has also been ill with a cold as well as her daughter.  Her older child  who is in college called her earlier tonight to report that he is also ill with viral URI symptoms.  She reports a history of COVID-19 x3.  She reports that her blood sugar has been running high.  She last checked it earlier today and it was 196.  The history is provided by the patient and medical records. No language interpreter was used.      Past Medical History:  Diagnosis Date   Anemia    Blood transfusion without reported diagnosis    Diabetes mellitus without complication (HCC)    Hypertension    Obesity    Thyroid disease     Patient Active Problem List   Diagnosis Date Noted   Acute respiratory disease due to COVID-19 virus 08/22/2019   Diabetes mellitus without complication (HCC)    Hypertension    Anemia    Pneumonia due to COVID-19 virus    Acute pain of right shoulder 02/05/2019    Past Surgical History:  Procedure Laterality Date   CARPAL TUNNEL RELEASE     CESAREAN SECTION     LEEP     THYROID SURGERY       OB History   No obstetric history on file.     No family history on file.  Social History   Tobacco Use   Smoking status: Never   Smokeless tobacco: Never  Vaping Use   Vaping Use: Never used  Substance Use Topics   Alcohol use: Yes    Comment: socially  Drug use: No    Home Medications Prior to Admission medications   Medication Sig Start Date End Date Taking? Authorizing Provider  ondansetron (ZOFRAN ODT) 4 MG disintegrating tablet Take 1 tablet (4 mg total) by mouth every 8 (eight) hours as needed for nausea or vomiting. 03/13/21  Yes Demri Poulton A, PA-C    Allergies    Adhesive [tape] and Cinnamon  Review of Systems   Review of Systems  Constitutional:  Positive for fatigue and fever. Negative for activity change and chills.  HENT:  Positive for congestion, ear pain and sore throat. Negative for dental problem, drooling, facial swelling, rhinorrhea, sneezing and voice change.   Eyes:  Negative for visual disturbance.   Respiratory:  Positive for cough, chest tightness and shortness of breath.   Cardiovascular:  Negative for chest pain and palpitations.  Gastrointestinal:  Positive for diarrhea. Negative for abdominal pain, constipation, nausea and vomiting.  Endocrine: Positive for polydipsia and polyuria.  Genitourinary:  Positive for frequency. Negative for difficulty urinating, dysuria, urgency, vaginal bleeding and vaginal pain.  Musculoskeletal:  Positive for myalgias. Negative for arthralgias, back pain and gait problem.  Skin:  Negative for rash and wound.  Allergic/Immunologic: Negative for immunocompromised state.  Neurological:  Positive for weakness. Negative for seizures, syncope, numbness and headaches.  Psychiatric/Behavioral:  Negative for confusion.    Physical Exam Updated Vital Signs BP 125/72   Pulse 62   Temp 98.4 F (36.9 C) (Oral)   Resp 20   Ht 5\' 2"  (1.575 m)   Wt 125.2 kg   LMP 02/16/2021   SpO2 100%   BMI 50.48 kg/m   Physical Exam Vitals and nursing note reviewed.  Constitutional:      General: She is not in acute distress.    Appearance: She is obese. She is not ill-appearing, toxic-appearing or diaphoretic.     Comments: Nontoxic-appearing  HENT:     Head: Normocephalic.     Ears:     Comments: Bilateral TMs with mild erythema.  No bulging.    Nose: Congestion present. No rhinorrhea.     Mouth/Throat:     Pharynx: No oropharyngeal exudate or posterior oropharyngeal erythema.  Eyes:     Conjunctiva/sclera: Conjunctivae normal.  Cardiovascular:     Rate and Rhythm: Normal rate and regular rhythm.     Heart sounds: No murmur heard.   No friction rub. No gallop.  Pulmonary:     Effort: Pulmonary effort is normal. No respiratory distress.     Breath sounds: No stridor. No wheezing, rhonchi or rales.     Comments: Lungs are clear to auscultation bilaterally.  No increased work of breathing. Chest:     Chest wall: No tenderness.  Abdominal:     General:  There is no distension.     Palpations: Abdomen is soft. There is no mass.     Tenderness: There is no abdominal tenderness. There is no right CVA tenderness, left CVA tenderness, guarding or rebound.     Hernia: No hernia is present.  Musculoskeletal:        General: No tenderness.     Cervical back: Neck supple.     Right lower leg: No edema.     Left lower leg: No edema.  Skin:    General: Skin is warm.     Coloration: Skin is not jaundiced or pale.     Findings: No bruising, erythema, lesion or rash.  Neurological:     General: No focal deficit  present.     Mental Status: She is alert.  Psychiatric:        Behavior: Behavior normal.    ED Results / Procedures / Treatments   Labs (all labs ordered are listed, but only abnormal results are displayed) Labs Reviewed  CBC WITH DIFFERENTIAL/PLATELET - Abnormal; Notable for the following components:      Result Value   WBC 11.4 (*)    Hemoglobin 9.9 (*)    HCT 34.4 (*)    MCV 69.2 (*)    MCH 19.9 (*)    MCHC 28.8 (*)    RDW 17.9 (*)    Neutro Abs 7.8 (*)    All other components within normal limits  URINALYSIS, ROUTINE W REFLEX MICROSCOPIC - Abnormal; Notable for the following components:   Leukocytes,Ua TRACE (*)    All other components within normal limits  URINALYSIS, MICROSCOPIC (REFLEX) - Abnormal; Notable for the following components:   Bacteria, UA MANY (*)    All other components within normal limits  RESP PANEL BY RT-PCR (FLU A&B, COVID) ARPGX2  BASIC METABOLIC PANEL  PREGNANCY, URINE    EKG None  Radiology DG Chest Portable 1 View  Result Date: 03/12/2021 CLINICAL DATA:  Nasal congestion, ear pain for 1 week. Worsening shortness of breath and cough. Fever today. EXAM: PORTABLE CHEST 1 VIEW COMPARISON:  02/09/2021 radiograph and CT FINDINGS: The cardiomediastinal contours are normal for portable AP technique and low lung volumes. Peribronchial thickening. Pulmonary vasculature is normal. No consolidation,  pleural effusion, or pneumothorax. No acute osseous abnormalities are seen. IMPRESSION: Peribronchial thickening.  No focal airspace disease. Electronically Signed   By: Keith Rake M.D.   On: 03/12/2021 23:25    Procedures Procedures   Medications Ordered in ED Medications  sodium chloride 0.9 % bolus 1,000 mL (0 mLs Intravenous Stopped 03/13/21 0052)  prochlorperazine (COMPAZINE) injection 10 mg (10 mg Intravenous Given 03/12/21 2344)  diphenhydrAMINE (BENADRYL) injection 25 mg (25 mg Intravenous Given 03/12/21 2344)  albuterol (VENTOLIN HFA) 108 (90 Base) MCG/ACT inhaler 8 puff (8 puffs Inhalation Given 03/13/21 0023)  AeroChamber Plus Flo-Vu Small device MISC 1 each (1 each Other Given 03/13/21 0023)    ED Course  I have reviewed the triage vital signs and the nursing notes.  Pertinent labs & imaging results that were available during my care of the patient were reviewed by me and considered in my medical decision making (see chart for details).    MDM Rules/Calculators/A&P                            35 year old female with a history of diabetes mellitus type 2, HTN, thyroid disease, anemia, and obesity who presents to the emergency department with a 2-week history of chest tightness.  About a week ago, she began developing URI symptoms.  Symptoms worsened over the last 4 days and she developed diarrhea.  Over the last 2 to 3 days, she has since developed worsening shortness of breath, cough.  Earlier today she was febrile.  Of note, patient is a diabetic and has also been endorsing worsening polyuria and polydipsia.  Vital signs are reassuring.  She has had multiple known sick contacts.  On physical exam, she is nontoxic-appearing.  No increased work of breathing or focal neurologic deficits.  Am concerned the patient may have influenza, will send COVID-19 and influenza testing.  However, since she has been having symptoms for several weeks, it is possible  that she could also  have a postviral bacterial pneumonia.  Will order chest x-ray.  Given her history of diabetes with known hyperglycemia at home today, we will also order basic labs since she has been having polyuria and polydipsia.  Will give headache cocktail and plan to reassess.  She is in agreement with the work-up and plan.  Chest x-ray is unremarkable.  She has no hyperglycemia or metabolic derangements.  Urinalysis is not concerning for infection.  CBC with microcytic anemia, but this is chronic.  Viral respiratory panel testing is pending.  Strong suspicion for influenza A.  I have a low suspicion for pyelonephritis, DKA, HHS, bacterial pneumonia, sepsis, CVA, meningitis.  Headache is significantly improved on reevaluation she is feeling much better.  Will discharge home with symptomatic management.  ER return precautions given.  She is hemodynamically stable in no acute distress.  Safer discharge home with outpatient follow-up as needed.  Final Clinical Impression(s) / ED Diagnoses Final diagnoses:  Viral URI with cough    Rx / DC Orders ED Discharge Orders          Ordered    ondansetron (ZOFRAN ODT) 4 MG disintegrating tablet  Every 8 hours PRN        03/13/21 0056             Joanne Gavel, PA-C 03/13/21 0102    Ripley Fraise, MD 03/13/21 [phone]

## 2021-03-13 LAB — URINALYSIS, ROUTINE W REFLEX MICROSCOPIC
Bilirubin Urine: NEGATIVE
Glucose, UA: NEGATIVE mg/dL
Hgb urine dipstick: NEGATIVE
Ketones, ur: NEGATIVE mg/dL
Nitrite: NEGATIVE
Protein, ur: NEGATIVE mg/dL
Specific Gravity, Urine: >=1.03 (ref 1.005–1.030)
pH: 5.5 (ref 5.0–8.0)

## 2021-03-13 LAB — CBC WITH DIFFERENTIAL/PLATELET
Abs Immature Granulocytes: 0.06 10*3/uL (ref 0.00–0.07)
Basophils Absolute: 0.1 10*3/uL (ref 0.0–0.1)
Basophils Relative: 1 %
Eosinophils Absolute: 0.1 10*3/uL (ref 0.0–0.5)
Eosinophils Relative: 1 %
HCT: 34.4 % — ABNORMAL LOW (ref 36.0–46.0)
Hemoglobin: 9.9 g/dL — ABNORMAL LOW (ref 12.0–15.0)
Immature Granulocytes: 1 %
Lymphocytes Relative: 25 %
Lymphs Abs: 2.9 10*3/uL (ref 0.7–4.0)
MCH: 19.9 pg — ABNORMAL LOW (ref 26.0–34.0)
MCHC: 28.8 g/dL — ABNORMAL LOW (ref 30.0–36.0)
MCV: 69.2 fL — ABNORMAL LOW (ref 80.0–100.0)
Monocytes Absolute: 0.4 10*3/uL (ref 0.1–1.0)
Monocytes Relative: 4 %
Neutro Abs: 7.8 10*3/uL — ABNORMAL HIGH (ref 1.7–7.7)
Neutrophils Relative %: 68 %
Platelets: 351 10*3/uL (ref 150–400)
RBC: 4.97 MIL/uL (ref 3.87–5.11)
RDW: 17.9 % — ABNORMAL HIGH (ref 11.5–15.5)
Smear Review: UNDETERMINED
WBC: 11.4 10*3/uL — ABNORMAL HIGH (ref 4.0–10.5)
nRBC: 0 % (ref 0.0–0.2)

## 2021-03-13 LAB — BASIC METABOLIC PANEL
Anion gap: 9 (ref 5–15)
BUN: 16 mg/dL (ref 6–20)
CO2: 22 mmol/L (ref 22–32)
Calcium: 9.8 mg/dL (ref 8.9–10.3)
Chloride: 106 mmol/L (ref 98–111)
Creatinine, Ser: 0.98 mg/dL (ref 0.44–1.00)
GFR, Estimated: >60 mL/min (ref >60)
Glucose, Bld: 94 mg/dL (ref 70–99)
Potassium: 3.9 mmol/L (ref 3.5–5.1)
Sodium: 137 mmol/L (ref 135–145)

## 2021-03-13 LAB — URINALYSIS, MICROSCOPIC (REFLEX)

## 2021-03-13 LAB — RESP PANEL BY RT-PCR (FLU A&B, COVID) ARPGX2
Influenza A by PCR: NEGATIVE
Influenza B by PCR: NEGATIVE
SARS Coronavirus 2 by RT PCR: NEGATIVE

## 2021-03-13 LAB — PREGNANCY, URINE: Preg Test, Ur: NEGATIVE

## 2021-03-13 MED ORDER — ONDANSETRON 4 MG PO TBDP: 4.0000 mg | ORAL_TABLET | Freq: Three times a day (TID) | ORAL | 0 refills | Status: AC | PRN

## 2021-03-13 NOTE — Discharge Instructions (Addendum)
Thank you for allowing me to care for you today in the Emergency Department.   Please check your MyChart account for your COVID and influenza testing results.  I suspect that you have a viral upper respiratory infection, and I am most suspicious for influenza.  Take 650 mg of Tylenol or 600 mg of ibuprofen with food every 6 hours for pain.  You can alternate between these 2 medications every 3 hours if your pain returns.  For instance, you can take Tylenol at noon, followed by a dose of ibuprofen at 3, followed by second dose of Tylenol and 6.  Let 1 tablet of Zofran dissolve in your tongue every 8 hours as needed for nausea or vomiting.  Make sure that you continue to take your home medications.  Make sure that you are drinking plenty of fluids and resting.  Return the emergency department if you develop respiratory distress, if you stop making urine, have persistent vomiting despite taking Zofran, or have other new, concerning symptoms.

## 2021-10-10 IMAGING — CT CT ANGIO CHEST
2 of 11 series · 17 of 36 positions shown · IV contrast (omnipaque)
Comparison: 11/17/2019

CLINICAL DATA: Rule out pulmonary embolus.

EXAM:
CT ANGIOGRAPHY CHEST WITH CONTRAST
TECHNIQUE: Multidetector CT imaging of the chest was performed using the
standard protocol during bolus administration of intravenous
contrast. Multiplanar CT image reconstructions and MIPs were
obtained to evaluate the vascular anatomy.
CONTRAST:  100mL OMNIPAQUE IOHEXOL 350 MG/ML SOLN

[Series 9: pe thins · axial · 0.63mm/px · z∈[-284,-62]mm · 16 of 252 slices shown]
[im 15/252  lung]
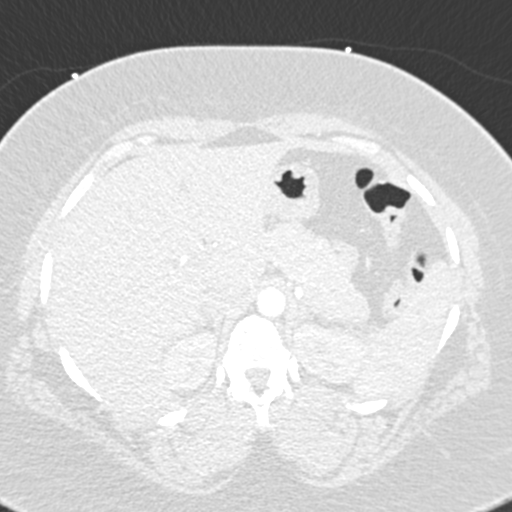
[im 30/252  mediastinal]
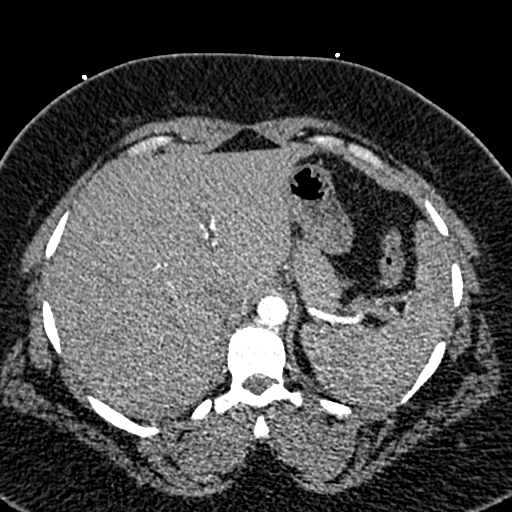
[im 45/252  lung]
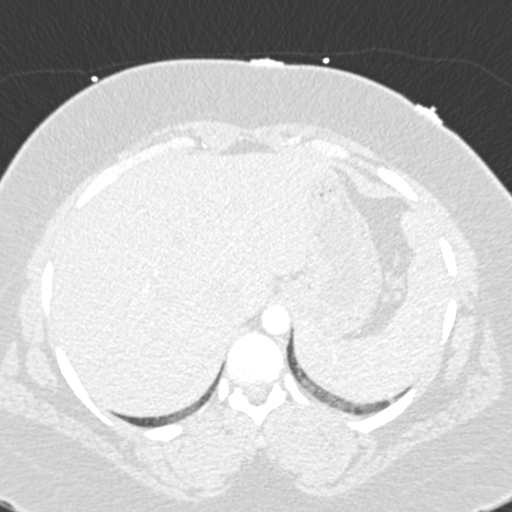
[im 60/252  mediastinal]
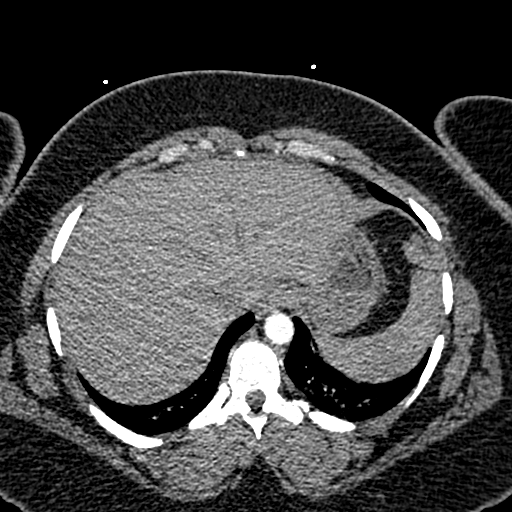
[im 74/252  lung]
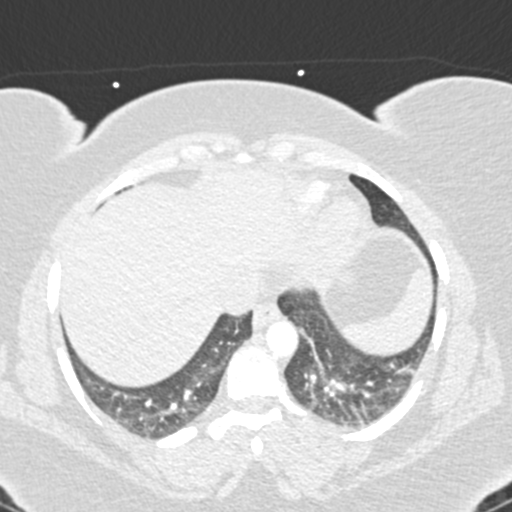
[im 89/252  mediastinal]
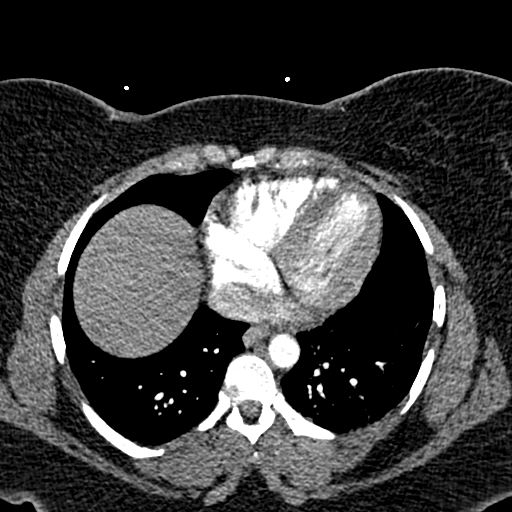
[im 104/252  lung]
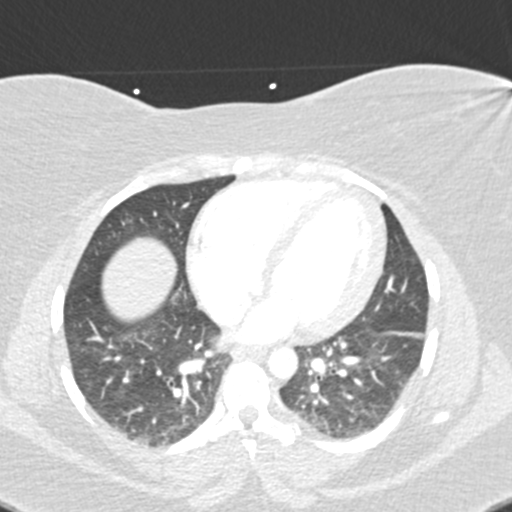
[im 119/252  mediastinal]
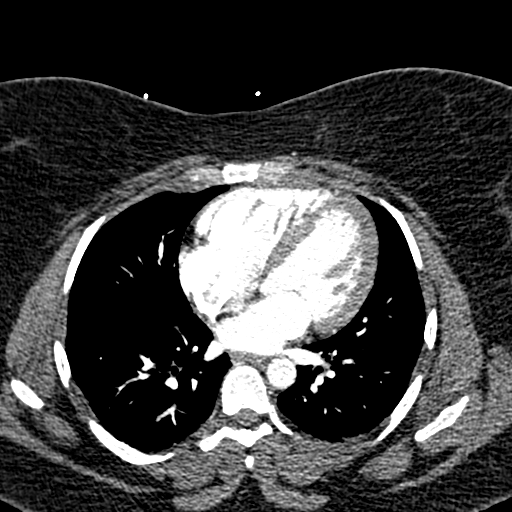
[im 133/252  lung]
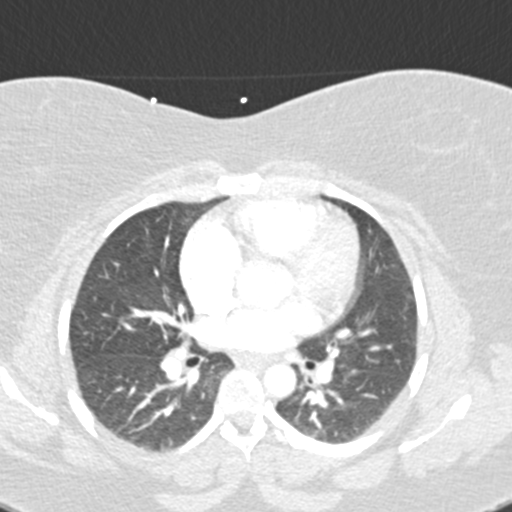
[im 148/252  mediastinal]
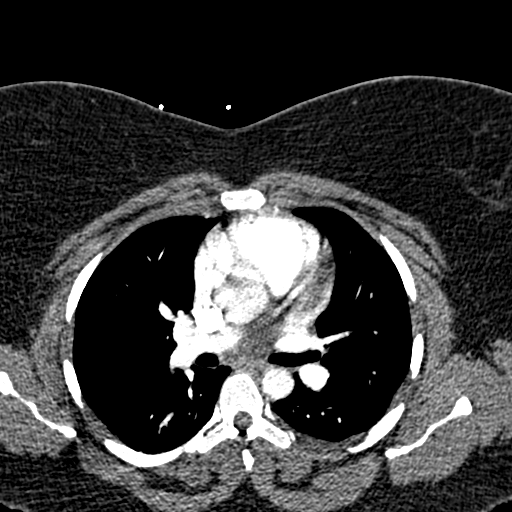
[im 163/252  lung]
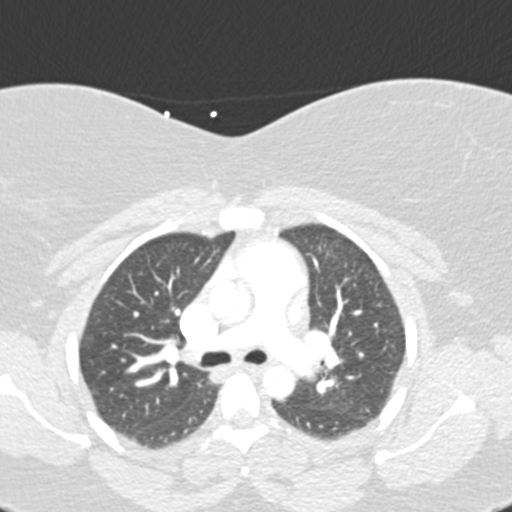
[im 178/252  mediastinal]
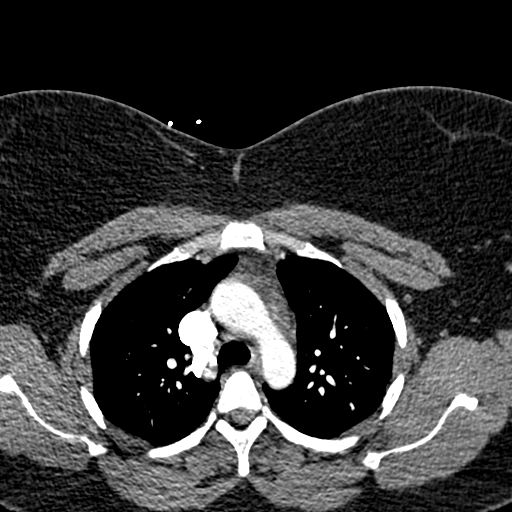
[im 192/252  lung]
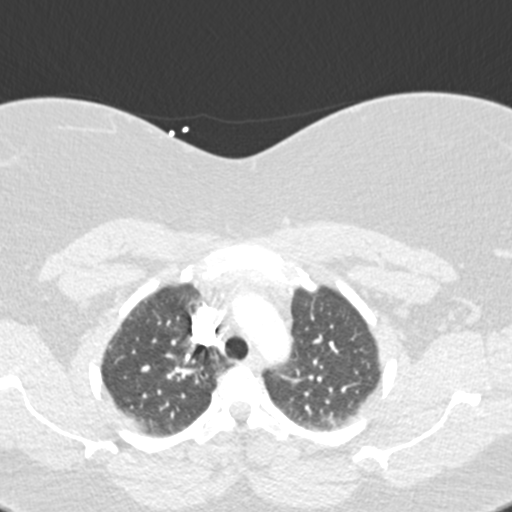
[im 207/252  mediastinal]
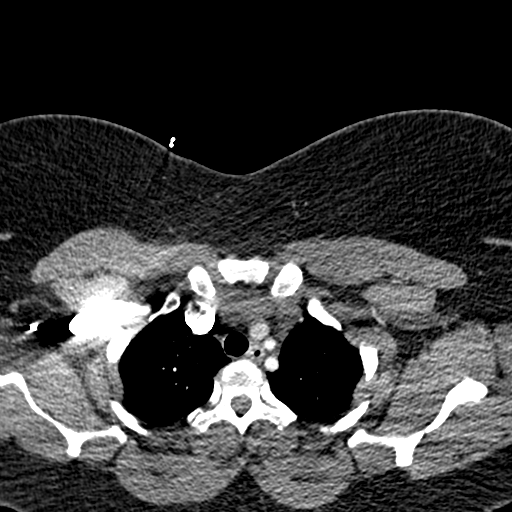
[im 222/252  lung]
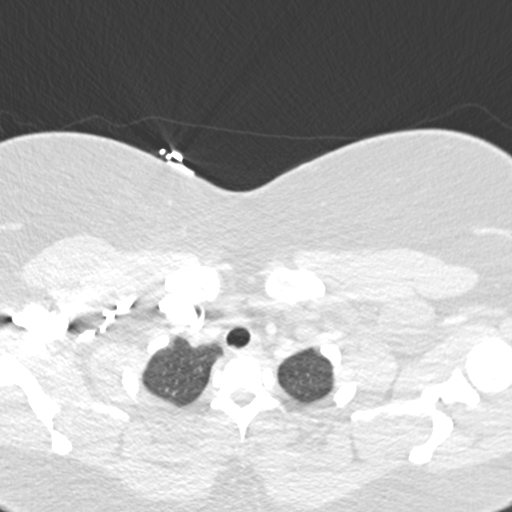
[im 237/252  mediastinal]
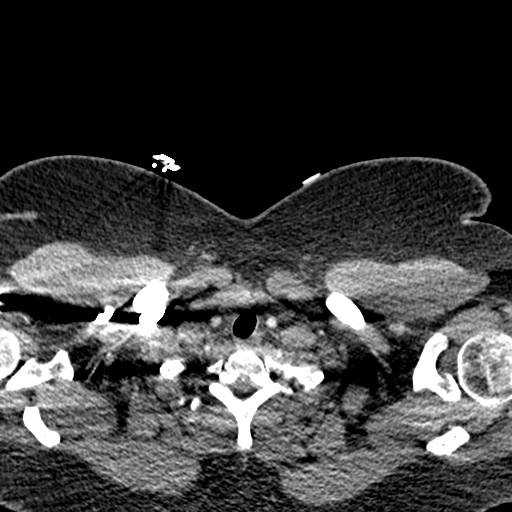

[Series 10: pe coronal mpr · coronal · 0.52mm/px · 1 of 129 slices shown]
[im 65/129  mediastinal]
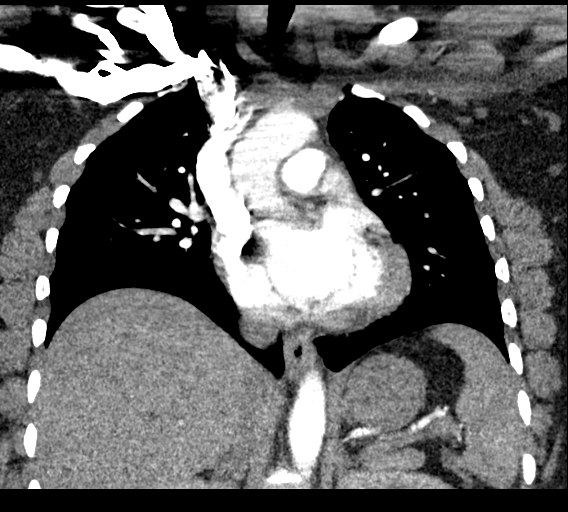

[17 of 36 positions shown; findings below may reference images not displayed]

FINDINGS: Cardiovascular: Satisfactory opacification of the pulmonary arteries
to the segmental level. No evidence of pulmonary embolism. Normal
heart size. No pericardial effusion.

Mediastinum/Nodes: No enlarged mediastinal, hilar, or axillary lymph
nodes. Thyroid gland, trachea, and esophagus demonstrate no
significant findings.

Lungs/Pleura: No pleural effusion, airspace consolidation or
pneumothorax. Dependent changes with subsegmental atelectasis noted
within the posterior lung bases.

Upper Abdomen: No acute findings.

Musculoskeletal: No chest wall abnormality. No acute or significant
osseous findings.

Review of the MIP images confirms the above findings.
IMPRESSION: 1. No evidence for acute pulmonary embolus.
2. Dependent changes with subsegmental atelectasis noted within the
posterior lung bases.

## 2021-11-10 IMAGING — DX DG CHEST 1V PORT
1 series · 1 of 1 positions shown · non-contrast
Comparison: 02/09/2021 radiograph and CT

CLINICAL DATA: Nasal congestion, ear pain for 1 week. Worsening
shortness of breath and cough. Fever today.

EXAM:
PORTABLE CHEST 1 VIEW

[chest ap]
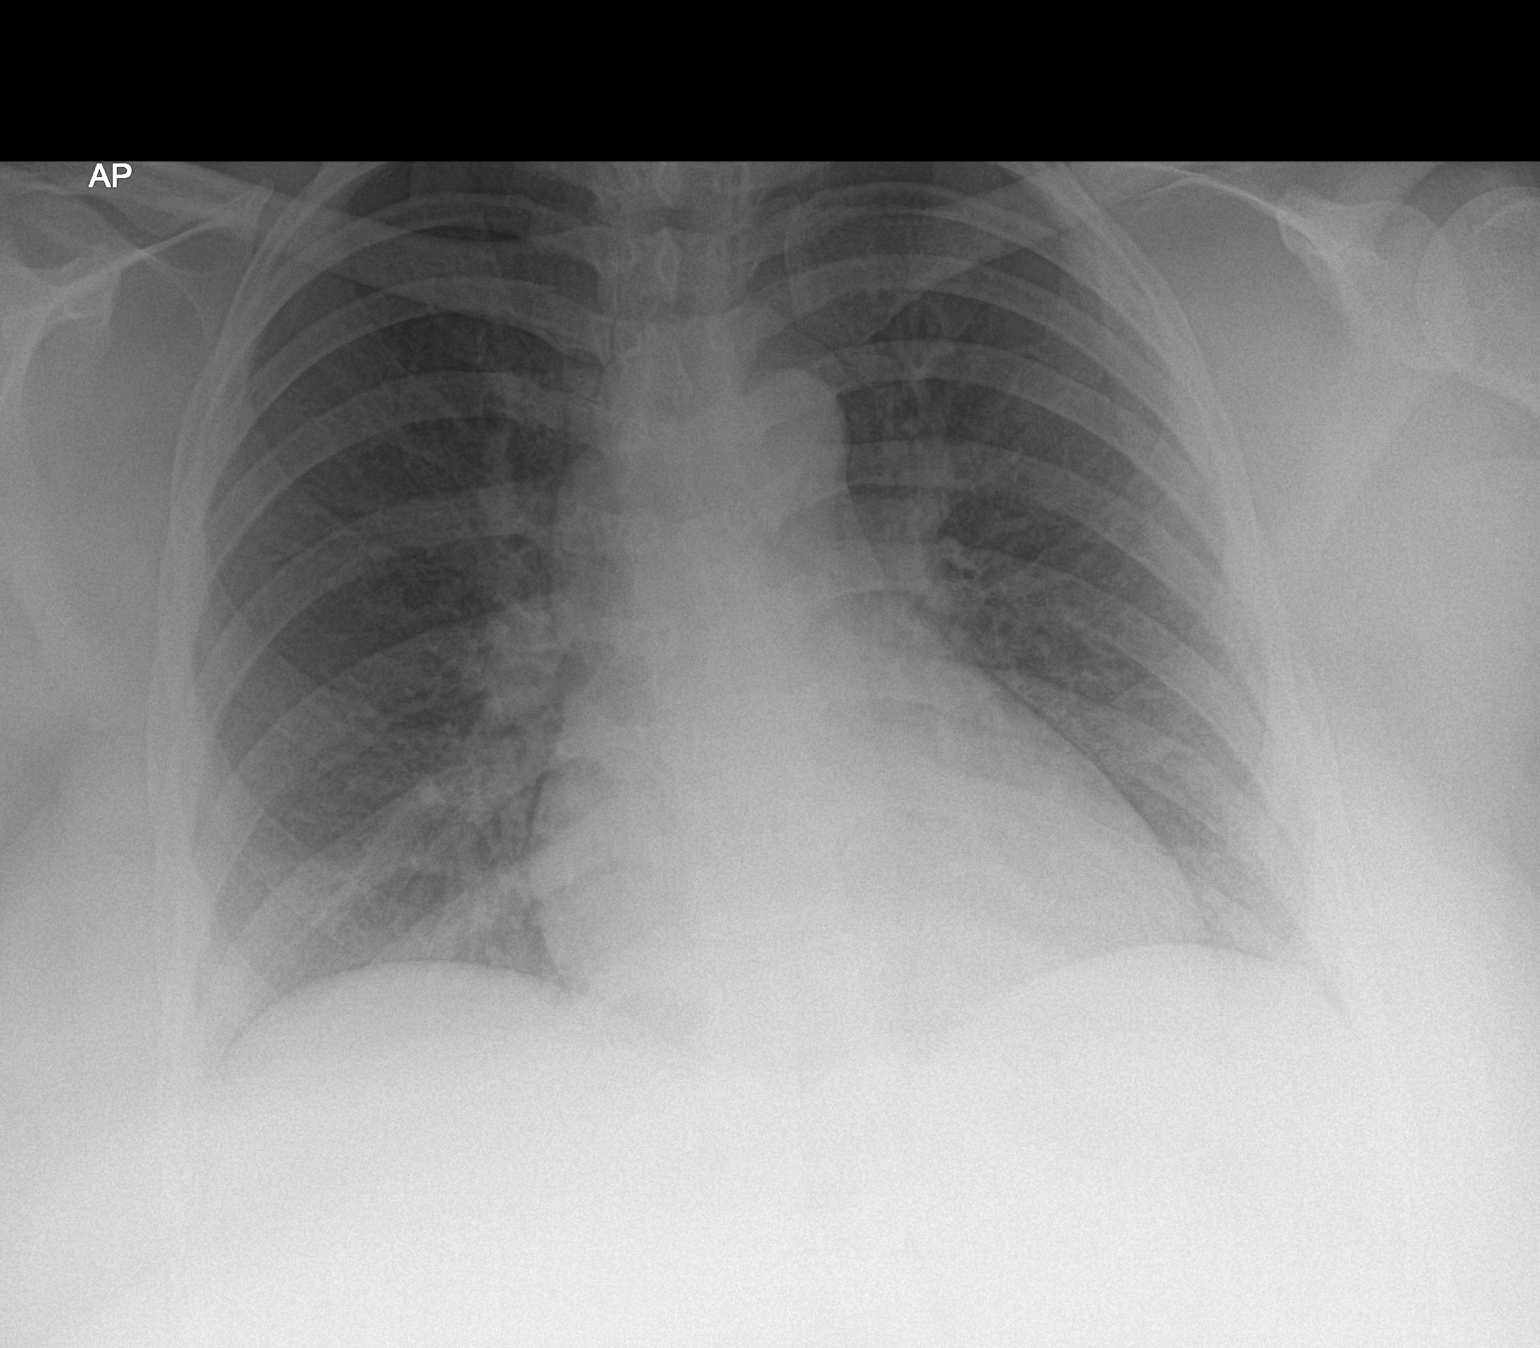

[1 of 1 positions shown; findings below may reference images not displayed]

FINDINGS: The cardiomediastinal contours are normal for portable AP technique
and low lung volumes. Peribronchial thickening. Pulmonary
vasculature is normal. No consolidation, pleural effusion, or
pneumothorax. No acute osseous abnormalities are seen.
IMPRESSION: Peribronchial thickening.  No focal airspace disease.

## 2022-08-27 ENCOUNTER — Encounter: Payer: Self-pay | Admitting: *Deleted

## 2023-02-10 ENCOUNTER — Encounter (HOSPITAL_BASED_OUTPATIENT_CLINIC_OR_DEPARTMENT_OTHER): Payer: Self-pay | Admitting: Emergency Medicine

## 2023-02-10 ENCOUNTER — Emergency Department (HOSPITAL_BASED_OUTPATIENT_CLINIC_OR_DEPARTMENT_OTHER)
Admission: EM | Admit: 2023-02-10 | Discharge: 2023-02-11 | Disposition: A | Discharge status: Home / Self Care | Payer: MEDICAID | Attending: Emergency Medicine | Admitting: Emergency Medicine

## 2023-02-10 DIAGNOSIS — N939 Abnormal uterine and vaginal bleeding, unspecified: Secondary | ICD-10-CM | POA: Diagnosis present

## 2023-02-10 DIAGNOSIS — M549 Dorsalgia, unspecified: Secondary | ICD-10-CM | POA: Diagnosis not present

## 2023-02-10 DIAGNOSIS — R1084 Generalized abdominal pain: Secondary | ICD-10-CM | POA: Insufficient documentation

## 2023-02-10 LAB — CBC WITH DIFFERENTIAL/PLATELET
Abs Immature Granulocytes: 0.03 10*3/uL (ref 0.00–0.07)
Basophils Absolute: 0.1 10*3/uL (ref 0.0–0.1)
Basophils Relative: 1 %
Eosinophils Absolute: 0.1 10*3/uL (ref 0.0–0.5)
Eosinophils Relative: 1 %
HCT: 30.7 % — ABNORMAL LOW (ref 36.0–46.0)
Hemoglobin: 9.4 g/dL — ABNORMAL LOW (ref 12.0–15.0)
Immature Granulocytes: 0 %
Lymphocytes Relative: 29 %
Lymphs Abs: 2.2 10*3/uL (ref 0.7–4.0)
MCH: 23.2 pg — ABNORMAL LOW (ref 26.0–34.0)
MCHC: 30.6 g/dL (ref 30.0–36.0)
MCV: 75.6 fL — ABNORMAL LOW (ref 80.0–100.0)
Monocytes Absolute: 0.4 10*3/uL (ref 0.1–1.0)
Monocytes Relative: 5 %
Neutro Abs: 4.7 10*3/uL (ref 1.7–7.7)
Neutrophils Relative %: 64 %
Platelets: 423 10*3/uL — ABNORMAL HIGH (ref 150–400)
RBC: 4.06 MIL/uL (ref 3.87–5.11)
RDW: 16.7 % — ABNORMAL HIGH (ref 11.5–15.5)
WBC: 7.4 10*3/uL (ref 4.0–10.5)
nRBC: 0 % (ref 0.0–0.2)

## 2023-02-10 LAB — BASIC METABOLIC PANEL
Anion gap: 9 (ref 5–15)
BUN: 16 mg/dL (ref 6–20)
CO2: 24 mmol/L (ref 22–32)
Calcium: 9.4 mg/dL (ref 8.9–10.3)
Chloride: 105 mmol/L (ref 98–111)
Creatinine, Ser: 0.96 mg/dL (ref 0.44–1.00)
GFR, Estimated: >60 mL/min (ref >60)
Glucose, Bld: 125 mg/dL — ABNORMAL HIGH (ref 70–99)
Potassium: 3.9 mmol/L (ref 3.5–5.1)
Sodium: 138 mmol/L (ref 135–145)

## 2023-02-10 LAB — HCG, SERUM, QUALITATIVE: Preg, Serum: NEGATIVE

## 2023-02-10 MED ORDER — MEDROXYPROGESTERONE ACETATE 10 MG PO TABS: 10.0000 mg | ORAL_TABLET | Freq: Every day | ORAL | 0 refills | Status: DC

## 2023-02-10 NOTE — ED Notes (Addendum)
aginal bleeding for the last 2 months. Just had a baby in October 26 2022. S/P  C-section. C/o cramping in lower back - States passing large clots - states has to push the clots out- Saturate pads (double up) within 4 hours. Blood at times appears to brown as if it is stopping - then it starts back bright red. Feels like body is swelling.

## 2023-02-10 NOTE — ED Provider Notes (Signed)
[place] EMERGENCY DEPARTMENT AT [facility] Provider Note   CSN: [id number] Arrival date & time: 02/10/23  2140     History  Chief Complaint  Patient presents with   Vaginal Bleeding    Colleen Schultz is a 37 y.o. female.  Patient with history of anemia presents to the emergency department today for evaluation of ongoing vaginal bleeding.  Patient gave birth by cesarean section on June 14 in [place]/[facility].  She did not have a menstrual period until early August when she started spotting.  Mid August she developed heavier bleeding which has been persistent over the past several weeks.  Bleeding waxes and wanes but is as heavy as where she is changing a pad every hour.  She does pass clots at times.  She has felt fatigued and had some shortness of breath with activity.  No full syncope.  She has had some leg swelling.  No chest pain.  No history of bleeding disorder.  She states that she has been pregnant 9 times.  Most recent hemoglobin was July 12 at 11.3.  Ferritin was low in August 2024.  No anterior abdominal pain but has had some cramping in her back.  Patient states that she has been doubling up on her iron supplementation at home, tolerating okay.       Home Medications Prior to Admission medications   Medication Sig Start Date End Date Taking? Authorizing Provider  ondansetron (ZOFRAN ODT) 4 MG disintegrating tablet Take 1 tablet (4 mg total) by mouth every 8 (eight) hours as needed for nausea or vomiting. 03/13/21   McDonald, Mia A, PA-C      Allergies    Adhesive [tape] and Cinnamon    Review of Systems   Review of Systems  Physical Exam Updated Vital Signs BP 132/77 (BP Location: Left Wrist)   Pulse 70   Temp (!) 96.1 F (35.6 C) (Oral)   Resp 18   Ht 5\' 2"  (1.575 m)   Wt 126.6 kg   LMP 12/28/2022 (Approximate)   SpO2 100%   BMI 51.03 kg/m   Physical Exam Vitals and nursing note reviewed.  Constitutional:      General: She is  not in acute distress.    Appearance: She is well-developed.  HENT:     Head: Normocephalic and atraumatic.     Right Ear: External ear normal.     Left Ear: External ear normal.     Nose: Nose normal.  Eyes:     Comments: Mild conjunctival pallor.  Cardiovascular:     Rate and Rhythm: Normal rate and regular rhythm.     Heart sounds: No murmur heard. Pulmonary:     Effort: No respiratory distress.     Breath sounds: No wheezing, rhonchi or rales.  Abdominal:     Palpations: Abdomen is soft.     Tenderness: There is abdominal tenderness. There is no guarding or rebound.     Comments: Mild generalized tenderness  Musculoskeletal:     Cervical back: Normal range of motion and neck supple.     Right lower leg: No edema.     Left lower leg: No edema.  Skin:    General: Skin is warm and dry.     Findings: No rash.  Neurological:     General: No focal deficit present.     Mental Status: She is alert. Mental status is at baseline.     Motor: No weakness.  Psychiatric:  Mood and Affect: Mood normal.     ED Results / Procedures / Treatments   Labs (all labs ordered are listed, but only abnormal results are displayed) Labs Reviewed  CBC WITH DIFFERENTIAL/PLATELET - Abnormal; Notable for the following components:      Result Value   Hemoglobin 9.4 (*)    HCT 30.7 (*)    MCV 75.6 (*)    MCH 23.2 (*)    RDW 16.7 (*)    Platelets 423 (*)    All other components within normal limits  BASIC METABOLIC PANEL - Abnormal; Notable for the following components:   Glucose, Bld 125 (*)    All other components within normal limits  HCG, SERUM, QUALITATIVE    EKG None  Radiology No results found.  Procedures Procedures    Medications Ordered in ED Medications - No data to display  ED Course/ Medical Decision Making/ A&P    Patient seen and examined. History obtained directly from patient.  Reviewed records from [facility] as well as previous lab work.  Labs/EKG:  Ordered CBC, BMP, pregnancy test.  Imaging: None ordered  Medications/Fluids: None ordered  Most recent vital signs reviewed and are as follows: BP 132/77 (BP Location: Left Wrist)   Pulse 70   Temp (!) 96.1 F (35.6 C) (Oral)   Resp 18   Ht 5\' 2"  (1.575 m)   Wt 126.6 kg   LMP 12/28/2022 (Approximate)   SpO2 100%   BMI 51.03 kg/m   Initial impression: General bleeding, concern for anemia given symptoms.  Normal heart rate.  11:48 PM Reassessment performed. Patient appears stable.  Labs personally reviewed and interpreted including: CBC with current hemoglobin 9.4 with MCV of 75; BMP glucose 125 normal anion gap; pregnancy negative.  Reviewed pertinent lab work and imaging with patient at bedside. Questions answered.   Most current vital signs reviewed and are as follows: BP 132/77 (BP Location: Left Wrist)   Pulse 70   Temp (!) 96.1 F (35.6 C) (Oral)   Resp 18   Ht 5\' 2"  (1.575 m)   Wt 126.6 kg   LMP 12/28/2022 (Approximate)   SpO2 100%   BMI 51.03 kg/m   Plan: Discharge to home.   Prescriptions written for: Provera 10 mg daily for 5 days.  Other home care instructions discussed: Continue iron supplementation, monitor symptoms.  ED return instructions discussed: Return with worsening symptoms, development of severe or worsening abdominal pain, heavier bleeding, passing out, worsening shortness of breath or chest pain.  Follow-up instructions discussed: Discussed that it is very important for her to call her OB/GYN tomorrow to schedule a follow-up appointment.  She states that she is willing to do this.                                 Medical Decision Making Amount and/or Complexity of Data Reviewed Labs: ordered.   Patient with abnormal vaginal bleeding.  Minimal pain, expected cramping only.  Her hemoglobin has dropped almost 2 points in the past 2 months.  Her vital signs are reasonable and her pulse rate is not elevated.  She is not hypotensive.  Bleeding  is waxing and waning.  She is on iron supplementation.  I think currently she is fairly well compensated.  Do not feel that she needs transferred for blood transfusion or emergent ultrasound.  She has GYN follow-up.  She is not pregnant.  Will prescribe 5  days of Provera and have her follow-up as outpatient to discuss further management and workup.  Patient does not have a history of blood clots.  The patient's vital signs, pertinent lab work and imaging were reviewed and interpreted as discussed in the ED course. Hospitalization was considered for further testing, treatments, or serial exams/observation. However as patient is well-appearing, has a stable exam, and reassuring studies today, I do not feel that they warrant admission at this time. This plan was discussed with the patient who verbalizes agreement and comfort with this plan and seems reliable and able to return to the Emergency Department with worsening or changing symptoms.          Final Clinical Impression(s) / ED Diagnoses Final diagnoses:  Abnormal vaginal bleeding    Rx / DC Orders ED Discharge Orders          Ordered    medroxyPROGESTERone (PROVERA) 10 MG tablet  Daily        02/10/23 2348              Renne Crigler, PA-C 02/10/23 2354    Linwood Dibbles, MD 02/12/23 1235

## 2023-02-10 NOTE — ED Triage Notes (Signed)
Pt states gave birth 3 mo ago, states started bleeding (vaginal)  in August and has not stopped since, having fatigue, weakness, headache and leg swelling.

## 2023-02-10 NOTE — Discharge Instructions (Signed)
Please read and follow all provided instructions.  Your diagnoses today include:  1. Abnormal vaginal bleeding     Tests performed today include: Blood cell counts and electrolytes: Your hemoglobin is 9.4 which is lower than in July when it was 11.2.  You are not to the point where he needed transfusion. Pregnancy test was negative Vital signs. See below for your results today.   Medications prescribed:  Provera: Short-term medication to help control vaginal bleeding  Take any prescribed medications only as directed.  Home care instructions:  Follow any educational materials contained in this packet.  Continue taking home iron supplements.  Follow-up instructions: Please call your OB/GYN tomorrow to schedule an outpatient follow-up appointment.  This is important to discuss long-term control of bleeding and for further workup to determine cause of bleeding.  Return instructions:  Please return to the Emergency Department if you experience worsening symptoms.  Return if you start passing heavier amounts of blood, feel very lightheaded or pass out, develop worsening shortness of breath or chest pain Please return if you have any other emergent concerns.  Additional Information:  Your vital signs today were: BP 132/77 (BP Location: Left Wrist)   Pulse 70   Temp (!) 96.1 F (35.6 C) (Oral)   Resp 18   Ht 5\' 2"  (1.575 m)   Wt 126.6 kg   LMP 12/28/2022 (Approximate)   SpO2 100%   BMI 51.03 kg/m  If your blood pressure (BP) was elevated above 135/85 this visit, please have this repeated by your doctor within one month. --------------

## 2023-02-11 MED ORDER — MEDROXYPROGESTERONE ACETATE 10 MG PO TABS: 10.0000 mg | ORAL_TABLET | Freq: Every day | ORAL | 0 refills | Status: AC

## 2024-05-11 ENCOUNTER — Other Ambulatory Visit (HOSPITAL_COMMUNITY): Payer: Self-pay | Admitting: Surgery

## 2024-05-27 ENCOUNTER — Encounter (HOSPITAL_COMMUNITY): Admission: RE | Admit: 2024-05-27 | Discharge: 2024-05-27 | Payer: Self-pay | Attending: Surgery

## 2024-05-27 ENCOUNTER — Ambulatory Visit (HOSPITAL_COMMUNITY)
Admission: RE | Admit: 2024-05-27 | Discharge: 2024-05-27 | Disposition: A | Discharge status: Home / Self Care | Source: Ambulatory Visit | Attending: Surgery | Admitting: Surgery

## 2024-05-27 ENCOUNTER — Encounter (HOSPITAL_COMMUNITY): Payer: Self-pay

## 2024-05-27 ENCOUNTER — Other Ambulatory Visit (HOSPITAL_COMMUNITY): Payer: Self-pay

## 2024-05-27 ENCOUNTER — Other Ambulatory Visit: Payer: Self-pay

## 2024-05-27 ENCOUNTER — Inpatient Hospital Stay (HOSPITAL_COMMUNITY): Admission: RE | Admit: 2024-05-27 | Payer: Self-pay

## 2024-05-27 DIAGNOSIS — Z01818 Encounter for other preprocedural examination: Secondary | ICD-10-CM | POA: Insufficient documentation

## 2024-05-27 NOTE — Progress Notes (Signed)
 " Chief Complaint: Chief Complaint  Patient presents with   Follow-up    Patient statesboth feet are numb and I have a lot of dead skin    HPI: 39 y/o DM  female patient presents today for evaluation and treatment of dry scaly skin along the plantar surface of the feet. The dry skiin has been present for several months. The patient has attempted to treat the rash with OTC medications without much success.  She has also had pedicures as well. She also has numbness and tingling in the feet.  Her most recent HGA1C was 5.8  ROS: @ROS @  Constitutional: Negative for activity change, appetite change, chills, fever and unexpected weight change.  HENT: Negative for nosebleeds, trouble swallowing and voice change.   Respiratory: Negative for chest tightness, shortness of breath, wheezing and stridor.   Cardiovascular: Negative for chest pain, palpitations and leg swelling.  Gastrointestinal: Negative for abdominal pain, blood in stool, constipation and diarrhea.  Genitourinary: Negative for flank pain,or  hematuria  VITALS Vitals:   05/27/24 0848  BP: 130/80    Past Medical Hx: Medical History[1]  Current Meds: Current Medications[2]  Allergies: Allergies[3]  Exam: Palpable Dorsalis pedis and Posterior tibial pulses Immediate CFT to all toes Plantar foot dry skin is noted with some associated erythematous papules The skin rash is in mocassin distribution Protective and light touch sensation is noted There is vibration noted both great toes There are no open lesions noted today or active drainage No associated hyperpigmentation is noted Intact light touch sensation  +5/5 DF/PF muscle strength is noted 0 degrees of right ankle joint dorsiflexion   Dx: 1. Tinea pedis of both feet        Imaging: None today  Plan: The patient presents today for initial evaluation and treatment of tinea pedis. I carefully explained my findings today to the actual patient. Today, I have  given the patient a Rx for a topical cream to use as directed with some refills  The patient will avoid going barefoot and will wear comfortable supportive shoes. The patient will F/U PRN  or sooner if any increased pain or increased redness or swelling. All questions were answered today.    Future Appt.: Scheduled Future Appointments       Provider Department Dept Phone Center   05/29/2024 10:30 AM Tinnie Hopping [facility] [place] - MPM Outpatient Physical Therapy [phone] [facility] [place]   06/10/2024 1:30 PM Delmus Garrie Lash [facility] [place] [facility] Internal Medicine [phone] [facility] [place]   06/26/2024 1:45 PM Mabel Curtistine Curd [facility]  - Orthopedics Sports Medicine [place] [phone] [facility] Mac[place] RAMAN   07/21/2024 2:15 PM DHP DERMATOLOGY RESIDENT [facility] - Dermatology Clinic [phone] [facility]   08/03/2024 3:40 PM Heron Kayla Junk [facility] - Endocrinology [place] [phone] [facility] [place]   08/07/2024 11:00 AM Carly Rosaline Fruits [facility]  - Orthopedics Sports Medicine [place] [phone] [facility] [place] RAMAN   09/30/2024 11:00 AM WF[facility] HEM ONC HP PERIPHERAL LAB [facility] [phone] Hematology Oncology [phone] [facility] [place]   09/30/2024 11:30 AM Vallathucherry JAYSON Clamp [facility] [phone] Hematology Oncology [phone] [facility] [place]        @[email or web address] @   05/27/2024        [1] Past Medical History: Diagnosis  Date   Abnormal menses 06/20/2015   Abnormal Pap smear of cervix    Acute pain of right shoulder 05/18/2015   Adenomyosis    ADHD (attention deficit hyperactivity disorder), combined type 09/15/2014   Anxiety    Arthralgia of both knees 07/16/2016   Benign essential hypertension 06/20/2015    Benign essential hypertension, antepartum (CMD) 09/17/2013   Formatting of this note might be different from the original. IMOUPDATE   Bladder wall thickening 06/08/2015   BMI 50.0-59.9, adult (CMD) 07/14/2018   Cervical dysplasia, moderate    Chronic bilateral low back pain without sciatica 01/29/2017   Chronic pain syndrome 01/29/2017   Clotting disorder (CMD)    Controlled type 2 diabetes mellitus without complication, without long-term current use of insulin  (CMD) 09/17/2013   COPD (chronic obstructive pulmonary disease) (CMD)    CTS (carpal tunnel syndrome)    Depression    Diabetes mellitus (CMD)    type 2    Diabetes mellitus type II, controlled (CMD)    non insulin  dependent   Diabetes mellitus without complication    (CMD) 01/29/2020   Endometritis 10/03/2017   Esophageal reflux 06/20/2015   Female infertility    Ganglion cyst of wrist, left 06/13/2020   Added automatically from request for surgery [id number]   General weakness 09/17/2013   GERD (gastroesophageal reflux disease)    Heart murmur    Hemorrhoids    Herpes simplex type 1 infection 06/20/2015   Hidradenitis axillaris    Hidradenitis suppurativa 10/08/2014   History of cesarean delivery affecting pregnancy (CMD) 12/09/2018   x3   History of COVID-19 08/2019   hospitalization with ventilator   History of loop electrical excision procedure (LEEP) 08/25/2015   02/2019 pap neg with (+)HRHPV, neg HRHPV16, neg HRHPV18 Plan repeat pap in 02/2020   History of pulmonary embolism 08/25/2015   Occurred during pregnancy   Hypertension    Hypocalcemia 11/20/2017   Hypokalemia 11/20/2017   Iron deficiency anemia due to chronic blood loss 06/20/2015   Malaise and fatigue 02/23/2019   Menometrorrhagia 01/19/2016   Neuropathy 08/01/2015   feet and legs   Ovarian cyst 10/03/2017   PE (pulmonary thromboembolism) (CMD)    2014, pregnancy related   Pneumonia due to COVID-19 virus 08/22/2019    Rectal bleeding 06/20/2015   Recurrent sinusitis 08/10/2020   Recurrent vaginitis 11/20/2017   Rheumatoid arthritis    (CMD)    knees   Right arm weakness 05/18/2015   RLS (restless legs syndrome) 07/16/2016   S/P carpal tunnel release 03/31/2015   STD (sexually transmitted disease)    HSV, chlamydia, GC   STD exposure 11/29/2015   Subchorionic hematoma in first trimester (CMD) 04/25/2022   Posterior placenta, [redacted]w[redacted]d small SCH Reviewed [facility] and risks with patient, return precautions given   Thickened endometrium 07/14/2018   Thyroid disease    Transfusion history    X3   Vitamin D deficiency 03/20/2019   Wheezing 03/20/2019  [2]  Current Outpatient Medications:    adalimumab (Humira,CF, Pen) 80 mg/0.8 mL pnkt injection, Inject 0.8 mL (80 mg total) under the skin every 14 (fourteen) days., Disp: 1.6 mL, Rfl: 5   aspirin 81 mg EC tablet, Take 1 tablet (81 mg total) by mouth daily., Disp: 30 tablet, Rfl: 0   atorvastatin (LIPITOR) 20 mg tablet, Take 1 tablet (20 mg total) by mouth at bedtime., Disp: 90 tablet, Rfl: 3   bismuth subsalicylate (PEPTO BISMOL) 262 mg chewable tablet, Chew 2 tablets (524 mg total)  4 (four) times a day., Disp: 30 tablet, Rfl: 3   blood-glucose sensor (Dexcom G7 Sensor), Inject 1 sensor to the skin every 10 days for continuous glucose monitoring, Disp: 3 each, Rfl: 11   budesonide-formoteroL (SYMBICORT;BREYNA) 80-4.5 mcg/actuation inhaler, Inhale 2 puffs in the morning and 2 puffs before bedtime., Disp: , Rfl:    cholecalciferol (Vitamin D3) 2,000 unit tablet, Take 2,000 Units by mouth daily., Disp: , Rfl:    clindamycin (CLINDAGEL) 1 % gel, Apply to underarms daily after shower, Disp: 60 g, Rfl: 11   clindamycin-benzoyl peroxide 1-5 % gel, Apply to affected areas twice daily, Disp: 50 g, Rfl: 1   diphenhydrAMINE -acetaminophen  (TYLENOL  PM) 25-500 mg tablet, Take 2 tablets by mouth nightly as needed., Disp: , Rfl:    doxycycline   (VIBRA -TABS) 100 mg tablet, Take once a day. Take with food and 8 oz water. Do not lie down for at least 30 minutes after., Disp: 90 tablet, Rfl: 3   dulaglutide (TRULICITY) 4.5 mg/0.5 mL subcutaneous pen injector, Inject 0.5 mL (4.5 mg total) under the skin every 7 days., Disp: 2 mL, Rfl: 0   ferrous sulfate  325 mg (65 mg iron) EC tablet, Take 1 tablet (325 mg total) by mouth every other day. (Patient taking differently: Take 325 mg by mouth daily with breakfast.), Disp: 45 tablet, Rfl: 3   glucose blood (Accu-Chek Guide test strips) test strip, Use as instructed, Disp: 200 strip, Rfl: 3   HYDROcodone -acetaminophen  (NORCO) 5-325 mg per tablet, Take 1 tablet by mouth every 6 (six) hours as needed for moderate pain (4-6)., Disp: 20 tablet, Rfl: 0   insulin  aspart U-100 (NovoLOG  FlexPen) 100 unit/mL (3 mL) pen, Inject 7 Units under the skin 3 (three) times a day before meals. (Patient taking differently: Inject 4 Units under the skin 3 (three) times a day before meals.), Disp: 15 mL, Rfl: 2   insulin  glargine (Lantus Solostar U-100 Insulin ) 100 unit/mL (3 mL) pen, Inject 38 Units under the skin nightly., Disp: , Rfl:    labetaloL (NORMODYNE) 300 mg tablet, Take 1 tablet (300 mg total) by mouth 2 (two) times a day., Disp: 180 tablet, Rfl: 3   Lancets (Accu-Chek Softclix Lancets) misc, Use 3 times daily as directed., Disp: 300 each, Rfl: 3   Lancets misc, Inject 1 each under the skin 4 (four) times a day., Disp: 200 each, Rfl: 3   letrozole  (FEMARA ) 2.5 mg tablet, Take 1 tablet (2.5 mg total) by mouth daily. Take with or without food., Disp: 90 tablet, Rfl: 3   metroNIDAZOLE (FLAGYL) 500 mg tablet, Take 1 tablet (500 mg total) by mouth 2 (two) times a day for 7 days., Disp: 14 tablet, Rfl: 0   omeprazole (PriLOSEC) 40 mg DR capsule, Take 1 capsule (40 mg total) by mouth 2 (two) times a day., Disp: 180 capsule, Rfl: 3   pen needle, diabetic 31 gauge x 3/16 ndle, Use to inject insulin  up to 4  times daily, Disp: 100 each, Rfl: 0   pen needle, diabetic 31 gauge x 5/16 ndle, 1 each by miscellaneous route Once Daily., Disp: 200 each, Rfl: 5   Senna Plus 8.6-50 mg per tablet, Take 1 tablet by mouth daily as needed., Disp: , Rfl:    sertraline (ZOLOFT) 50 mg tablet, Take 1 tablet (50 mg total) by mouth daily. (Patient taking differently: Take 50 mg by mouth daily.), Disp: 90 tablet, Rfl: 3   tiZANidine (ZANAFLEX) 4 mg tablet, Take 1 tablet (4 mg total) by  mouth every 8 (eight) hours as needed for muscle spasms., Disp: 30 tablet, Rfl: 0   ZINC ORAL, Take 1 tablet by mouth daily., Disp: , Rfl:    adalimumab (Humira,CF, Pen) 80 mg/0.8 mL pnkt injection, Inject 160mg  under the skin on day 1.  Then inject 80mg  under the skin every 15 days (Patient not taking: Reported on 05/27/2024), Disp: 2.4 mL, Rfl: 0 [3] Allergies Allergen Reactions   Cinnamon Hives and Rash   Adhesive Rash and Itching   Dermatitis Antigens Rash   Ozempic [Semaglutide] GI Intolerance and Dizziness  "

## 2024-05-28 ENCOUNTER — Encounter: Payer: Self-pay | Admitting: Dietician

## 2024-05-28 ENCOUNTER — Encounter: Payer: Self-pay | Attending: Surgery | Admitting: Dietician

## 2024-05-28 VITALS — Ht 62.0 in | Wt 273.8 lb

## 2024-05-28 DIAGNOSIS — E119 Type 2 diabetes mellitus without complications: Secondary | ICD-10-CM | POA: Insufficient documentation

## 2024-05-28 DIAGNOSIS — E669 Obesity, unspecified: Secondary | ICD-10-CM | POA: Diagnosis present

## 2024-05-28 NOTE — Progress Notes (Signed)
 Nutrition Assessment for Bariatric Surgery: Pre-Surgery Behavioral and Nutrition Intervention Program   Medical Nutrition Therapy  Appt Start Time: 1514    End Time: 1619  Patient was seen on 05/28/2024 for Pre-Operative Nutrition Assessment. Purpose of todays visit  enhance perioperative outcomes along with a healthy weight maintenance   Referral stated Supervised Weight Loss (SWL) visits needed: 3 months  Planned surgery: Gastric By-Pass Pt expectation of surgery: eat healthier; learn patterns for long term success  NUTRITION ASSESSMENT   Anthropometrics  Start weight at [facility]: 273.8 lbs (date: 05/28/2024)  Height: 62 in BMI: 50.08 kg/m2     Clinical   Pharmacotherapy: History of weight loss medication used: Ozempic for diabetes, now on Trulicity; pt states she has tried vitamins in the past for weight loss.  Medical hx: GERD, obesity, Diabetes Medications: medroxyPROGESTERone  (PROVERA ) 10 MG tablet ondansetron  (ZOFRAN  ODT) 4 MG disintegrating tablet Labs: Vitamin D 28.1, A1c 5.8, glucose 138,  Notable signs/symptoms: right arm in sling from shoulder surgery Any previous deficiencies? No  Evaluation of Nutritional Deficiencies: Micronutrient Nutrition Focused Physical Exam: Hair: No issues observed Eyes: No issues observed Mouth: No issues observed Neck: No issues observed Nails: No issues observed Skin: No issues observed  Lifestyle & Dietary Hx  Pt arrived with right arm is sling, stating she had shoulder surgery.  Pt states she has tried vitamins for weight loss in the past. 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 Pt states she has diabetes.  Current Physical Activity Recommendations state 150 minutes per week of moderate to vigorous movement including Cardio and 1-2 days of resistance activities as well as flexibility/balance activities:  Pts current physical activity: ADLs, with 0% recommendation reached   Sleep Hygiene:  duration and quality: okay, 4-5 hours per night, pt states she always wakes up  Current Patient Perceived Stress Level as stated by pt on a scale of 1-10:  5       Stress Management Techniques: breath, listen to meditation music  According to the Dietary Guidelines for Americans Recommendation: equivalent 1.5-2 cups fruits per day, equivalent 2-3 cups vegetables per day and at least half all grains whole  Fruit servings per day (on average): 4+, meeting 100% recommendation  Non-starchy vegetable servings per day (on average): 2, meeting 66-100% recommendation  Whole Grains per day (on average): 2  Number of meals missed/skipped per week out of 21: 7-10  24-Hr Dietary Recall First Meal: skip or oatmeal or eggs Snack: sometimes peanut butter crackers or banana Second Meal: skip or turkey sandwich with chicken noodle soup or left overs from the night before Snack: sometimes chips Third Meal: baked chicken or steak or shrimp with baked beans or rice or mac and cheese or corn on the cobb. Snack: cake or fruit snacks Beverages: zero sugar welch's, water, sugar free lemonade  Alcoholic beverages per week: 0   Estimated Energy Needs Calories: 1500  NUTRITION DIAGNOSIS  Overweight/obesity (-3.3) related to past poor dietary habits and physical inactivity as evidenced by patient w/ planned gastric by-pass surgery following dietary guidelines for continued weight loss.  NUTRITION INTERVENTION  Nutrition counseling (C-1) and education (E-2) to facilitate bariatric surgery goals.  Educated pt on micronutrient deficiencies post-surgery and behavioral/dietary strategies to start in order to mitigate that risk   Behavioral and Dietary Interventions Pre-Op Goals Reviewed with the Patient Nutrition: Healthy Eating Behaviors Switch to non-caloric, non-carbonated and non-caffeinated beverages such as  water, unsweetened tea, Crystal Light and zero  calorie beverages (aim for 64 oz. per day) Cut out  grazing between meals or at night  Find a protein shake you like Eat every 3-5 hours        Eliminate distractions while eating (TV, computer, reading, driving, texting) Take 79-69 minutes to eat a meal  Decrease high sugar foods/decrease high fat/fried foods Eliminate alcoholic beverages Increase protein intake (eggs, fish, chicken, yogurt) before surgery Eat non starchy vegetables 2 times a day 7 days a week Eat complex carbohydrates such as whole grains and fruits   Behavioral Modification: Physical Activity Increase my usual daily activity (use stairs, park farther, etc.) Engage in _______________________  activity  _______ minutes ______ times per week  Other:    *Goals that are bolded indicate the pt would like to start working towards these  Handouts Provided Include  Bariatric Surgery handouts (Nutrition Visits, Pre Surgery Behavioral Change Goals, Protein Shakes Brands to Choose From, Vitamins & Mineral Supplementation)  Learning Style & Readiness for Change Teaching method utilized: Visual, Auditory, and hands on  Demonstrated degree of understanding via: Teach Back  Readiness Level: preparation Barriers to learning/adherence to lifestyle change: nothing identified  RD's Notes for Next Visit Patient progress towards chosen goals   MONITORING & EVALUATION Dietary intake, weekly physical activity, body weight, and preoperative behavioral change goals   Next Steps  Patient is to follow up at [facility] in 2-3 weeks for first SWL visit.

## 2024-06-11 ENCOUNTER — Encounter: Attending: Surgery | Admitting: Dietician

## 2024-06-11 ENCOUNTER — Encounter: Payer: Self-pay | Admitting: Dietician

## 2024-06-11 VITALS — Ht 62.0 in | Wt 274.6 lb

## 2024-06-11 DIAGNOSIS — E119 Type 2 diabetes mellitus without complications: Secondary | ICD-10-CM | POA: Diagnosis present

## 2024-06-11 DIAGNOSIS — E669 Obesity, unspecified: Secondary | ICD-10-CM | POA: Insufficient documentation

## 2024-06-11 NOTE — Progress Notes (Signed)
 Supervised Weight Loss Visit Bariatric Nutrition Education Appt Start Time: 1510    End Time: 1559  Planned surgery: Gastric By-Pass Pt expectation of surgery: eat healthier; learn patterns for long term success  Referral stated Supervised Weight Loss (SWL) visits needed: 3 months  1 out of 3 SWL Appointments   NUTRITION ASSESSMENT   Anthropometrics  Start weight at [facility]: 273.8 lbs (date: 05/28/2024)  Height: 62 in Weight today: 274.6 lbs BMI: 50.08 kg/m2     Clinical  Medical hx: GERD, obesity, Diabetes Medications: ondansetron  (ZOFRAN  ODT) 4 MG disintegrating tablet; Lipitor 20 mg; Symbicort inhaler; Trulicity  Labs: Vitamin D 28.1, A1c 5.8, glucose 138,  Notable signs/symptoms: right arm in sling from shoulder surgery Any previous deficiencies? No  Lifestyle & Dietary Hx Pt arrived not wearing the sling she was in last time, stating she just came from physical therapy. Pt states it will take 4 months to go back to work. Pt states she has a Dexcom CGM. Pt states her glucose levels after a meal are 120-150. Pt states she is trying not to skip breakfast, stating she got some bagels for breakfast.  Estimated daily fluid intake: 64 oz Supplements: vit D, zinc, iron Current average weekly physical activity: ADLs; physical therapy, two days per week, 45-60 minutes  24-Hr Dietary Recall First Meal: bagel with cream cheese or protein oatmeal Snack: sometimes peanut butter crackers or banana Second Meal: turkey sandwich with chicken noodle soup or left overs from the night before or tossed salad with eggs and other vegetables Snack: fruit Third Meal: baked chicken or steak or shrimp with baked beans or rice or mac and cheese or corn on the cobb. Snack: cake or fruit snacks Beverages: zero sugar welch's, water, sugar free lemonade, Propel waters  Alcoholic beverages per week: 0   Estimated Energy Needs Calories: 1500  NUTRITION DIAGNOSIS  Overweight/obesity ([place]-3.3) related  to past poor dietary habits and physical inactivity as evidenced by patient w/ planned gastric by-pass surgery following dietary guidelines for continued weight loss.   NUTRITION INTERVENTION  Nutrition counseling (C-1) and education (E-2) to facilitate bariatric surgery goals.  Including a source of protein with every meal or snack helps you build the eating pattern that becomes essential after bariatric surgery. Lean, food-based proteins--such as eggs, Greek yogurt, chicken, fish, tofu, cottage cheese, or beans--support fullness, steady energy, and muscle maintenance. Practicing this now makes it easier to meet your protein needs later, when portions are smaller and every bite needs to be nutrient-dense.  Aiming for 64 ounces of hydrating fluids each day--free of sugar, calories, caffeine, and alcohol--helps you build the hydration habits that become essential after bariatric surgery. Practicing this now supports steady energy, better digestion, and easier appetite control, while also preparing your body for the postoperative requirement to sip consistently throughout the day. Choosing water, flavored water enhancers without calories, herbal teas, or electrolyte drinks without sugar keeps hydration simple and gentle on your stomach, making the transition after surgery much smoother.  Pre-Op Goals Progress & New Goals Continue: Cut out grazing between meals or at night Continue: Eat every 3-5 hours   Continue: Decrease high sugar foods/decrease high fat/fried foods New: aim for a protein with every meal or snack New: aim for 64 oz of hydrating fluids; non-caloric, non-carbonated and non-caffeinated beverages such as  water, unsweetened tea, Crystal Light and zero calorie beverages  Handouts Provided Include  Goals printed Bariatric MyPlate  Learning Style & Readiness for Change Teaching method utilized: Visual &  Auditory  Demonstrated degree of understanding via: Teach Back  Readiness Level:  preparation Barriers to learning/adherence to lifestyle change: nothing identified  RD's Notes for next Visit  Patient progress towards chosen goals  MONITORING & EVALUATION Dietary intake, weekly physical activity, body weight, and pre-op goals.  Next Steps  Patient is to return to [facility] in 1 month for next SWL visit.

## 2024-06-17 ENCOUNTER — Ambulatory Visit (HOSPITAL_COMMUNITY)
Admission: RE | Admit: 2024-06-17 | Discharge: 2024-06-17 | Disposition: A | Source: Ambulatory Visit | Attending: Surgery

## 2024-07-13 ENCOUNTER — Encounter: Admitting: Dietician
